# Patient Record
Sex: Female | Born: 1995 | Race: Black or African American | Hispanic: No | Marital: Single | Smoking: Current every day smoker
Health system: Southern US, Community
[De-identification: ages and names within clinical notes are randomized; demographics above are authoritative.]

## PROBLEM LIST (undated history)

## (undated) ENCOUNTER — Emergency Department (HOSPITAL_COMMUNITY): Admission: EM | Payer: No Typology Code available for payment source | Source: Home / Self Care

## (undated) DIAGNOSIS — J302 Other seasonal allergic rhinitis: Secondary | ICD-10-CM

## (undated) DIAGNOSIS — Z8619 Personal history of other infectious and parasitic diseases: Secondary | ICD-10-CM

---
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
## 2016-05-18 ENCOUNTER — Ambulatory Visit (HOSPITAL_COMMUNITY)
Admission: EM | Admit: 2016-05-18 | Discharge: 2016-05-18 | Disposition: A | Discharge status: Home / Self Care | Payer: No Typology Code available for payment source | Attending: Emergency Medicine | Admitting: Emergency Medicine

## 2016-05-18 ENCOUNTER — Encounter (HOSPITAL_COMMUNITY): Payer: Self-pay | Admitting: Emergency Medicine

## 2016-05-18 DIAGNOSIS — N760 Acute vaginitis: Secondary | ICD-10-CM | POA: Insufficient documentation

## 2016-05-18 DIAGNOSIS — N3001 Acute cystitis with hematuria: Secondary | ICD-10-CM | POA: Insufficient documentation

## 2016-05-18 DIAGNOSIS — F17210 Nicotine dependence, cigarettes, uncomplicated: Secondary | ICD-10-CM | POA: Insufficient documentation

## 2016-05-18 DIAGNOSIS — B009 Herpesviral infection, unspecified: Secondary | ICD-10-CM | POA: Insufficient documentation

## 2016-05-18 LAB — POCT URINALYSIS DIP (DEVICE)
Bilirubin Urine: NEGATIVE
GLUCOSE, UA: NEGATIVE mg/dL
Ketones, ur: NEGATIVE mg/dL
Nitrite: NEGATIVE
Protein, ur: NEGATIVE mg/dL
Specific Gravity, Urine: 1.025 (ref 1.005–1.030)
Urobilinogen, UA: 0.2 mg/dL (ref 0.0–1.0)
pH: 6.5 (ref 5.0–8.0)

## 2016-05-18 LAB — POCT PREGNANCY, URINE: Preg Test, Ur: NEGATIVE

## 2016-05-18 MED ORDER — VALACYCLOVIR HCL 1 G PO TABS: 1000.0000 mg | ORAL_TABLET | Freq: Two times a day (BID) | ORAL | 0 refills | Status: AC

## 2016-05-18 MED ORDER — FLUCONAZOLE 150 MG PO TABS: ORAL_TABLET | ORAL | 0 refills | Status: DC

## 2016-05-18 MED ORDER — NITROFURANTOIN MONOHYD MACRO 100 MG PO CAPS: 100.0000 mg | ORAL_CAPSULE | Freq: Two times a day (BID) | ORAL | 0 refills | Status: DC

## 2016-05-18 NOTE — ED Notes (Signed)
Call back number verified and updated in EPIC... Adv pt to not have SI until lab results comeback neg.... Also adv pt lab results will be on MyChart; instructions given .... Pt verb understanding.   

## 2016-05-18 NOTE — ED Triage Notes (Signed)
Patient has multiple complaints.  Reports burning with urination.  Patient reports vaginal discharge and has noticed a bump.  All symptoms have developed over the past week

## 2016-05-18 NOTE — Discharge Instructions (Signed)
Educated on Safe sexual practices.

## 2016-05-18 NOTE — ED Provider Notes (Signed)
CSN: [id number]     Arrival date & time 05/18/16  1041 History   None    Chief Complaint  Patient presents with  . SEXUALLY TRANSMITTED DISEASE  . Recurrent UTI   (Consider location/radiation/quality/duration/timing/severity/associated sxs/prior Treatment) 20 yo female presented with CC of pain and burning with urination x 6 days. White/clear vaginal discharge x 3-4 days with lesions to vaginal area.Pt reports having multiple sexual partners.      Past Medical History:  Diagnosis Date  . Seasonal allergies    History reviewed. No pertinent surgical history. No family history on file. Social History  Substance Use Topics  . Smoking status: Current Every Day Smoker    Types: Cigarettes  . Smokeless tobacco: Not on file  . Alcohol use Yes   OB History    No data available     Review of Systems: Pain and burning with urination. White Vaginal discharge with burning. Itchy/painful bumps to vaginal area.  Allergies  Patient has no known allergies.  Home Medications   Prior to Admission medications   Medication Sig Start Date End Date Taking? Authorizing Provider  cetirizine (ZYRTEC) 10 MG tablet Take 10 mg by mouth daily as needed for allergies.    Historical Provider, MD  ciprofloxacin (CIPRO) 500 MG tablet Take 1 tablet (500 mg total) by mouth 2 (two) times daily. Patient not taking: Reported on 05/18/2016 06/23/14   Viviano SimasLauren Robinson, NP  ibuprofen (ADVIL,MOTRIN) 200 MG tablet Take 200 mg by mouth every 6 (six) hours as needed for mild pain.    Historical Provider, MD  ibuprofen (ADVIL,MOTRIN) 600 MG tablet Take 1 tablet (600 mg total) by mouth every 6 (six) hours as needed. 05/30/14   Jennifer Piepenbrink, PA-C  nitrofurantoin, macrocrystal-monohydrate, (MACROBID) 100 MG capsule Take 1 capsule (100 mg total) by mouth 2 (two) times daily. Patient not taking: Reported on 05/18/2016 05/11/15   Samantha Tripp [place], PA-C  olopatadine (PATANOL) 0.1 % ophthalmic solution Place 1  drop into both eyes 2 (two) times daily as needed for allergies.    Historical Provider, MD  ondansetron (ZOFRAN ODT) 4 MG disintegrating tablet 1 tab sl q6-8h prn n/v/abd cramping 03/23/14   Viviano SimasLauren Robinson, NP  ondansetron (ZOFRAN ODT) 4 MG disintegrating tablet Take 1 tablet (4 mg total) by mouth every 8 (eight) hours as needed for nausea or vomiting. 05/30/14   Francee PiccoloJennifer Piepenbrink, PA-C   Meds Ordered and Administered this Visit  Medications - No data to display  BP 118/71 (BP Location: Right Arm)   Pulse 78   Temp 98.3 F (36.8 C) (Oral)   Resp 18   LMP 05/08/2016   SpO2 98%  No data found.   Physical Exam: Adbomen soft , non tender. BS present all 4 quadrants. Whiote curdy vaginal discharge with multiple painful vesicular ledions to B/l external labial folds  Urgent Care Course   Clinical Course     Procedures (including critical care time)  Labs Review Labs Reviewed  POCT URINALYSIS DIP (DEVICE) - Abnormal; Notable for the following:       Result Value   Hgb urine dipstick TRACE (*)    Leukocytes, UA TRACE (*)    All other components within normal limits  HSV CULTURE AND TYPING  URINE CULTURE  POCT PREGNANCY, URINE  CERVICOVAGINAL ANCILLARY ONLY    Imaging Review No results found.   Visual Acuity Review  Right Eye Distance:   Left Eye Distance:   Bilateral Distance:    Right Eye Near:  Left Eye Near:    Bilateral Near:         MDM   1. Vaginitis and vulvovaginitis   2. Acute cystitis with hematuria   3. Herpes simplex    Pt treated for UTI/Vaginal Cabndidiasis and Herpes simples based on history, clinical presentation and physical exam. Pt agrees with plan of care.Urine Culture and Swab for Vaginitis sent out. Educated on safe sex practices. Physical Exam Chaperoned By Gabriel RainwaterKimberly RN    Brixon Zhen, NP 05/18/16 1224    Laquandra Carrillo, NP 05/18/16 1228

## 2016-05-19 LAB — URINE CULTURE: Special Requests: NORMAL

## 2016-05-19 LAB — CERVICOVAGINAL ANCILLARY ONLY
Chlamydia: POSITIVE — AB
Neisseria Gonorrhea: POSITIVE — AB
WET PREP (BD AFFIRM): POSITIVE — AB

## 2016-05-20 LAB — HSV CULTURE AND TYPING

## 2016-05-31 ENCOUNTER — Encounter (HOSPITAL_COMMUNITY): Payer: Self-pay | Admitting: Emergency Medicine

## 2016-05-31 ENCOUNTER — Ambulatory Visit (HOSPITAL_COMMUNITY)
Admission: EM | Admit: 2016-05-31 | Discharge: 2016-05-31 | Disposition: A | Discharge status: Home / Self Care | Payer: PPO | Attending: Emergency Medicine | Admitting: Emergency Medicine

## 2016-05-31 DIAGNOSIS — A749 Chlamydial infection, unspecified: Secondary | ICD-10-CM | POA: Diagnosis not present

## 2016-05-31 DIAGNOSIS — A5400 Gonococcal infection of lower genitourinary tract, unspecified: Secondary | ICD-10-CM

## 2016-05-31 MED ORDER — CEFTRIAXONE SODIUM 250 MG IJ SOLR
250.0000 mg | Freq: Once | INTRAMUSCULAR | Status: AC
  Administered 2016-05-31: 250 mg via INTRAMUSCULAR

## 2016-05-31 MED ORDER — CEFTRIAXONE SODIUM 250 MG IJ SOLR
INTRAMUSCULAR | Status: AC
  Filled 2016-05-31: qty 250

## 2016-05-31 MED ORDER — AZITHROMYCIN 250 MG PO TABS
1000.0000 mg | ORAL_TABLET | Freq: Once | ORAL | Status: AC
  Administered 2016-05-31: 1000 mg via ORAL

## 2016-05-31 MED ORDER — STERILE WATER FOR INJECTION IJ SOLN
INTRAMUSCULAR | Status: AC
Start: 2016-05-31 — End: 2016-05-31
  Filled 2016-05-31: qty 10

## 2016-05-31 MED ORDER — AZITHROMYCIN 250 MG PO TABS
ORAL_TABLET | ORAL | Status: AC
  Filled 2016-05-31: qty 4

## 2016-05-31 NOTE — Discharge Instructions (Signed)
Return to Urgent Care with any complication.

## 2016-05-31 NOTE — ED Triage Notes (Signed)
[facility]. Patient has already been evaluated by E. Piedad ClimesHonig , MD and patient presented for treatment only no Provider visit.

## 2017-06-07 ENCOUNTER — Other Ambulatory Visit: Payer: Self-pay

## 2017-06-07 ENCOUNTER — Encounter (HOSPITAL_COMMUNITY): Payer: Self-pay | Admitting: Emergency Medicine

## 2017-06-07 ENCOUNTER — Emergency Department (HOSPITAL_COMMUNITY): Payer: Self-pay

## 2017-06-07 ENCOUNTER — Emergency Department (HOSPITAL_COMMUNITY)
Admission: EM | Admit: 2017-06-07 | Discharge: 2017-06-07 | Disposition: A | Discharge status: Home / Self Care | Payer: Self-pay | Attending: Emergency Medicine | Admitting: Emergency Medicine

## 2017-06-07 DIAGNOSIS — F17210 Nicotine dependence, cigarettes, uncomplicated: Secondary | ICD-10-CM | POA: Insufficient documentation

## 2017-06-07 DIAGNOSIS — R072 Precordial pain: Secondary | ICD-10-CM | POA: Insufficient documentation

## 2017-06-07 DIAGNOSIS — Z79899 Other long term (current) drug therapy: Secondary | ICD-10-CM | POA: Insufficient documentation

## 2017-06-07 LAB — BASIC METABOLIC PANEL
Anion gap: 9 (ref 5–15)
BUN: 9 mg/dL (ref 6–20)
CALCIUM: 9.2 mg/dL (ref 8.9–10.3)
CO2: 22 mmol/L (ref 22–32)
CREATININE: 0.79 mg/dL (ref 0.44–1.00)
Chloride: 105 mmol/L (ref 101–111)
GFR calc Af Amer: >60 mL/min (ref >60)
Glucose, Bld: 103 mg/dL — ABNORMAL HIGH (ref 65–99)
Potassium: 4.2 mmol/L (ref 3.5–5.1)
SODIUM: 136 mmol/L (ref 135–145)

## 2017-06-07 LAB — I-STAT BETA HCG BLOOD, ED (MC, WL, AP ONLY)

## 2017-06-07 LAB — CBC
HCT: 40.2 % (ref 36.0–46.0)
Hemoglobin: 13.6 g/dL (ref 12.0–15.0)
MCH: 30.3 pg (ref 26.0–34.0)
MCHC: 33.8 g/dL (ref 30.0–36.0)
MCV: 89.5 fL (ref 78.0–100.0)
PLATELETS: 358 10*3/uL (ref 150–400)
RBC: 4.49 MIL/uL (ref 3.87–5.11)
RDW: 13.1 % (ref 11.5–15.5)
WBC: 5.7 10*3/uL (ref 4.0–10.5)

## 2017-06-07 LAB — I-STAT TROPONIN, ED
Troponin i, poc: 0 ng/mL (ref 0.00–0.08)
Troponin i, poc: 0 ng/mL (ref 0.00–0.08)

## 2017-06-07 LAB — D-DIMER, QUANTITATIVE: D-Dimer, Quant: <0.27 ug/mL-FEU (ref 0.00–0.50)

## 2017-06-07 MED ORDER — CYCLOBENZAPRINE HCL 10 MG PO TABS: 10.0000 mg | ORAL_TABLET | Freq: Two times a day (BID) | ORAL | 0 refills | Status: AC | PRN

## 2017-06-07 MED ORDER — CYCLOBENZAPRINE HCL 10 MG PO TABS
10.0000 mg | ORAL_TABLET | Freq: Once | ORAL | Status: AC
  Administered 2017-06-07: 10 mg via ORAL
  Filled 2017-06-07: qty 1

## 2017-06-07 NOTE — ED Triage Notes (Signed)
Pt presents with CP and sob related to taking deep breaths; pt denies anxiety, CAD, asthma; pt states began 2-3 days ago and put off being seen for the issue but symptoms never went away; pt tearful in triage

## 2017-06-07 NOTE — ED Provider Notes (Signed)
[facility] Provider Note   CSN: [id number] Arrival date & time: 06/07/17  0416     History   Chief Complaint Chief Complaint  Patient presents with  . Shortness of Breath  . Chest Pain    HPI Sheri Spencer is a 22 y.o. female.  The history is provided by the patient and medical records. No language interpreter was used.  Chest Pain   This is a new problem. The current episode started more than 2 days ago. The problem occurs constantly. The problem has not changed since onset.The pain is associated with breathing and coughing. The pain is present in the substernal region. The pain is at a severity of 6/10. The pain is moderate. The quality of the pain is described as sharp and pleuritic. The pain does not radiate. Duration of episode(s) is 3 days. The symptoms are aggravated by deep breathing and certain positions. Associated symptoms include cough (dry). Pertinent negatives include no abdominal pain, no back pain, no diaphoresis, no exertional chest pressure, no fever, no headaches, no hemoptysis, no leg pain, no lower extremity edema, no malaise/fatigue, no nausea, no numbness, no palpitations, no PND, no shortness of breath, no sputum production, no syncope, no vomiting and no weakness. She has tried nothing for the symptoms. The treatment provided no relief.  Pertinent negatives for past medical history include no CAD, no COPD, no CHF, no DVT and no seizures.  Pertinent negatives for family medical history include: no CAD, no heart disease, no hyperlipidemia, no early MI and no PE.    Past Medical History:  Diagnosis Date  . Seasonal allergies     There are no active problems to display for this patient.   History reviewed. No pertinent surgical history.  OB History    No data available       Home Medications    Prior to Admission medications   Medication Sig Start Date End Date Taking? Authorizing Provider  cetirizine  (ZYRTEC) 10 MG tablet Take 10 mg by mouth daily as needed for allergies.    [provider]  fluconazole (DIFLUCAN) 150 MG tablet Take 1 tab PO Q 72 hours 05/18/16   Multani, Bhupinder, NP  ibuprofen (ADVIL,MOTRIN) 200 MG tablet Take 200 mg by mouth every 6 (six) hours as needed for mild pain.    [provider]  ibuprofen (ADVIL,MOTRIN) 600 MG tablet Take 1 tablet (600 mg total) by mouth every 6 (six) hours as needed. 05/30/14   Piepenbrink, Victorino DikeJennifer, PA-C  nitrofurantoin, macrocrystal-monohydrate, (MACROBID) 100 MG capsule Take 1 capsule (100 mg total) by mouth 2 (two) times daily. 05/18/16   Multani, Bhupinder, NP  olopatadine (PATANOL) 0.1 % ophthalmic solution Place 1 drop into both eyes 2 (two) times daily as needed for allergies.    [provider]  ondansetron (ZOFRAN ODT) 4 MG disintegrating tablet 1 tab sl q6-8h prn n/v/abd cramping 03/23/14   Viviano Simasobinson, Lauren, NP  ondansetron (ZOFRAN ODT) 4 MG disintegrating tablet Take 1 tablet (4 mg total) by mouth every 8 (eight) hours as needed for nausea or vomiting. 05/30/14   Piepenbrink, Victorino DikeJennifer, PA-C    Family History History reviewed. No pertinent family history.  Social History Social History   Tobacco Use  . Smoking status: Current Every Day Smoker    Types: Cigarettes  . Smokeless tobacco: Never Used  Substance Use Topics  . Alcohol use: Yes    Comment: social  . Drug use: Yes    Types: Marijuana  Allergies   Patient has no known allergies.   Review of Systems Review of Systems  Constitutional: Negative for chills, diaphoresis, fatigue, fever and malaise/fatigue.  HENT: Negative for congestion.   Eyes: Negative for photophobia and visual disturbance.  Respiratory: Positive for cough (dry). Negative for hemoptysis, sputum production, choking, chest tightness, shortness of breath, wheezing and stridor.   Cardiovascular: Positive for chest pain. Negative for palpitations, syncope and PND.    Gastrointestinal: Negative for abdominal pain, nausea and vomiting.  Genitourinary: Negative for dysuria, enuresis, flank pain and frequency.  Musculoskeletal: Negative for back pain, neck pain and neck stiffness.  Skin: Negative for rash and wound.  Neurological: Negative for seizures, weakness, light-headedness, numbness and headaches.  Psychiatric/Behavioral: Negative for agitation and confusion.  All other systems reviewed and are negative.    Physical Exam Updated Vital Signs BP 122/76 (BP Location: Right Arm)   Pulse 70   Temp 97.7 F (36.5 C) (Oral)   Resp 12   Ht 5' (1.524 m)   Wt 70.3 kg (155 lb)   LMP 05/22/2017   SpO2 100%   BMI 30.27 kg/m   Physical Exam  Constitutional: She is oriented to person, place, and time. She appears well-developed and well-nourished.  Non-toxic appearance. She does not appear ill. No distress.  HENT:  Head: Normocephalic.  Nose: Nose normal.  Mouth/Throat: Oropharynx is clear and moist. No oropharyngeal exudate.  Eyes: Pupils are equal, round, and reactive to light.  Neck: Normal range of motion.  Cardiovascular: Normal rate and intact distal pulses. Exam reveals no S3 and no S4.  No murmur heard. Pulmonary/Chest: Effort normal and breath sounds normal. No respiratory distress. She has no wheezes. She has no rales. She exhibits tenderness.    Abdominal: Soft. She exhibits no distension. There is no tenderness.  Musculoskeletal: She exhibits tenderness. She exhibits no edema.  Neurological: She is alert and oriented to person, place, and time. No sensory deficit. She exhibits normal muscle tone.  Skin: Capillary refill takes less than 2 seconds. She is not diaphoretic. No erythema.  Psychiatric: She has a normal mood and affect.  Nursing note and vitals reviewed.    ED Treatments / Results  Labs (all labs ordered are listed, but only abnormal results are displayed) Labs Reviewed  BASIC METABOLIC PANEL - Abnormal; Notable for  the following components:      Result Value   Glucose, Bld 103 (*)    All other components within normal limits  CBC  D-DIMER, QUANTITATIVE (NOT AT [facility])  I-STAT TROPONIN, ED  I-STAT BETA HCG BLOOD, ED ([facility], [facility], [facility] ONLY)  I-STAT TROPONIN, ED    EKG  EKG Interpretation  Date/Time:  Thursday June 07 2017 04:24:54 EST Ventricular Rate:  90 PR Interval:    QRS Duration: 76 QT Interval:  336 QTC Calculation: 411 R Axis:   91 Text Interpretation:  Sinus rhythm Rightward axis Abnormal ECG When compared with ECG of 01/18/2014, No significant change was found Confirmed by Dione Booze ([id number]) on 06/07/2017 4:59:09 AM Also confirmed by Dione Booze ([id number]), editor Elita Quick [phone])  on 06/07/2017 6:51:41 AM       Radiology Dg Chest 2 View  Result Date: 06/07/2017 CLINICAL DATA:  22 year old female with chest pain. EXAM: CHEST  2 VIEW COMPARISON:  Chest radiograph dated 01/18/2014 FINDINGS: The heart size and mediastinal contours are within normal limits. Both lungs are clear. The visualized skeletal structures are unremarkable. IMPRESSION: No active cardiopulmonary disease. Electronically Signed   By:  Elgie Collard M.D.   On: 06/07/2017 05:32    Procedures Procedures (including critical care time)  Medications Ordered in ED Medications  cyclobenzaprine (FLEXERIL) tablet 10 mg (10 mg Oral Given 06/07/17 1610)     Initial Impression / Assessment and Plan / ED Course  I have reviewed the triage vital signs and the nursing notes.  Pertinent labs & imaging results that were available during my care of the patient were reviewed by me and considered in my medical decision making (see chart for details).     Sheri Spencer is a 22 y.o. female with no significant past medical history who presents with chest pain.  Patient reports that for the last 3 days, she has been having a very pleuritic and sharp chest pain whenever she takes a deep breath.  It is in her central and left  chest.  She denies any trauma.  She reports a dry cough but no fevers, chills, hemoptysis, or production in the cough.  She denies any nausea, vomiting, palpitations, diaphoresis, and no family history of thromboembolism disease or heart disease.  She denies taking any medications including no birth control medications.  She describes the pain as a 6 out of 10 on arrival but was a 10 out of 10 earlier today.  It started suddenly and has been fluctuating but constant.  On exam, patient has tenderness in her central left chest.  Patient had no focal neurologic deficits on my exam.  Pulses are symmetric in upper and lower 70s.  Patient's lungs were clear with no crackles or wheezes.  Abdomen was nontender.  EKG showed no STEMI or other significant abnormality.  Chest x-ray was unremarkable.  Due to the extreme pleurisy of the symptoms, a d-dimer will be added with a delta troponin.  Patient is likely having muscular skeletal pain thus, will be given a muscle relaxant during initial workup.  If symptoms are improving and workup is reassuring, anticipate patient will be safe for discharge home.  D-dimer negative and delta troponin negative.  Patient reports her pain had not worsened.  Patient had a heart score of 1.  Given reassuring heart score negative troponin, patient was felt stable for discharge home.  Patient was given information for PCP follow-up as well as return precautions for new or worsened symptoms.  Patient given prescription for Flexeril as this is likely musculoskeletal in nature.  Patient and family had no other questions or concerns and patient was discharged in good condition after reassuring workup.   Final Clinical Impressions(s) / ED Diagnoses   Final diagnoses:  Precordial pain    ED Discharge Orders        Ordered    cyclobenzaprine (FLEXERIL) 10 MG tablet  2 times daily PRN     06/07/17 0929     \ Clinical Impression: 1. Precordial pain     Disposition:  Discharge  Condition: Good  I have discussed the results, Dx and Tx plan with the pt(& family if present). He/she/they expressed understanding and agree(s) with the plan. Discharge instructions discussed at great length. Strict return precautions discussed and pt &/or family have verbalized understanding of the instructions. No further questions at time of discharge.    Discharge Medication List as of 06/07/2017  9:31 AM    START taking these medications   Details  cyclobenzaprine (FLEXERIL) 10 MG tablet Take 1 tablet (10 mg total) by mouth 2 (two) times daily as needed for muscle spasms., Starting Thu 06/07/2017, Print  Follow Up: [facility] [address] [place] [address] [phone] Schedule an appointment as soon as possible for a visit    [facility] EMERGENCY DEPARTMENT [address] [id number] mc [place] [address] [phone]  If symptoms worsen     Tegeler, Canary Brim, MD 06/07/17 1143

## 2017-06-07 NOTE — Discharge Instructions (Signed)
After your workup, we suspect the cause of your pain is musculoskeletal.  Your tests were all reassuring.  Please use the muscle relaxant to help with the discomfort.  Please follow-up with a primary care physician in several days for reassessment.  If any symptoms change or worsen, please return to the nearest emergency department.

## 2017-10-30 ENCOUNTER — Emergency Department (HOSPITAL_COMMUNITY)
Admission: EM | Admit: 2017-10-30 | Discharge: 2017-10-30 | Disposition: A | Discharge status: Home / Self Care | Payer: Self-pay | Attending: Emergency Medicine | Admitting: Emergency Medicine

## 2017-10-30 ENCOUNTER — Emergency Department (HOSPITAL_COMMUNITY): Payer: Self-pay

## 2017-10-30 ENCOUNTER — Other Ambulatory Visit: Payer: Self-pay

## 2017-10-30 DIAGNOSIS — Y999 Unspecified external cause status: Secondary | ICD-10-CM | POA: Insufficient documentation

## 2017-10-30 DIAGNOSIS — Y939 Activity, unspecified: Secondary | ICD-10-CM | POA: Insufficient documentation

## 2017-10-30 DIAGNOSIS — Y929 Unspecified place or not applicable: Secondary | ICD-10-CM | POA: Insufficient documentation

## 2017-10-30 DIAGNOSIS — J302 Other seasonal allergic rhinitis: Secondary | ICD-10-CM | POA: Insufficient documentation

## 2017-10-30 DIAGNOSIS — M79602 Pain in left arm: Secondary | ICD-10-CM

## 2017-10-30 DIAGNOSIS — F17210 Nicotine dependence, cigarettes, uncomplicated: Secondary | ICD-10-CM | POA: Insufficient documentation

## 2017-10-30 DIAGNOSIS — X501XXA Overexertion from prolonged static or awkward postures, initial encounter: Secondary | ICD-10-CM | POA: Insufficient documentation

## 2017-10-30 DIAGNOSIS — Z79899 Other long term (current) drug therapy: Secondary | ICD-10-CM | POA: Insufficient documentation

## 2017-10-30 DIAGNOSIS — S63502A Unspecified sprain of left wrist, initial encounter: Secondary | ICD-10-CM | POA: Insufficient documentation

## 2017-10-30 MED ORDER — IBUPROFEN 600 MG PO TABS: 600.0000 mg | ORAL_TABLET | Freq: Four times a day (QID) | ORAL | 0 refills | Status: AC | PRN

## 2017-10-30 MED ORDER — IBUPROFEN 100 MG/5ML PO SUSP
10.0000 mg/kg | Freq: Once | ORAL | Status: AC
Start: 2017-10-30 — End: 2017-10-30
  Administered 2017-10-30: 718 mg via ORAL
  Filled 2017-10-30 (×2): qty 40

## 2017-10-30 NOTE — Discharge Instructions (Addendum)
1. Medications: alternate ibuprofen and tylenol for pain control, usual home medications 2. Treatment: rest, ice, elevate and use brace, drink plenty of fluids, gentle stretching 3. Follow Up: Please followup with orthopedics as directed or your PCP in 1 week if no improvement for discussion of your diagnoses and further evaluation after today's visit; if you do not have a primary care doctor use the resource guide provided to find one; Please return to the ER for worsening symptoms or other concerns  
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
## 2018-03-12 ENCOUNTER — Encounter (HOSPITAL_COMMUNITY): Payer: Self-pay | Admitting: *Deleted

## 2018-03-12 ENCOUNTER — Inpatient Hospital Stay (HOSPITAL_COMMUNITY)
Admission: AD | Admit: 2018-03-12 | Discharge: 2018-03-13 | Disposition: A | Discharge status: Home / Self Care | Payer: PPO | Source: Ambulatory Visit | Attending: Obstetrics & Gynecology | Admitting: Obstetrics & Gynecology

## 2018-03-12 DIAGNOSIS — F17210 Nicotine dependence, cigarettes, uncomplicated: Secondary | ICD-10-CM | POA: Insufficient documentation

## 2018-03-12 DIAGNOSIS — N730 Acute parametritis and pelvic cellulitis: Secondary | ICD-10-CM | POA: Insufficient documentation

## 2018-03-12 DIAGNOSIS — N760 Acute vaginitis: Secondary | ICD-10-CM | POA: Insufficient documentation

## 2018-03-12 DIAGNOSIS — B9689 Other specified bacterial agents as the cause of diseases classified elsewhere: Secondary | ICD-10-CM

## 2018-03-12 HISTORY — DX: Personal history of other infectious and parasitic diseases: Z86.19

## 2018-03-12 LAB — POCT PREGNANCY, URINE: Preg Test, Ur: NEGATIVE

## 2018-03-12 NOTE — MAU Note (Signed)
PT SAYS  LMP WAS  LAST Tuesday  THROUGH Friday.   THEN  STARTED BLEEDING AGAIN Sunday.  USUALLY CYCLES ARE REG.   NO BIRTH CONTROL.  LAST SEX-  1 WEEK AGO.  NO HPT.      HURTS LOWER ABD - STARTED  Sunday-    TYLENOL - 2 TABS-    NO RELIEF

## 2018-03-13 ENCOUNTER — Encounter (HOSPITAL_COMMUNITY): Payer: Self-pay | Admitting: Student

## 2018-03-13 DIAGNOSIS — N730 Acute parametritis and pelvic cellulitis: Secondary | ICD-10-CM

## 2018-03-13 LAB — URINALYSIS, ROUTINE W REFLEX MICROSCOPIC
Bacteria, UA: NONE SEEN
Bilirubin Urine: NEGATIVE
Glucose, UA: NEGATIVE mg/dL
KETONES UR: NEGATIVE mg/dL
Nitrite: NEGATIVE
PROTEIN: NEGATIVE mg/dL
Specific Gravity, Urine: 1.023 (ref 1.005–1.030)
pH: 6 (ref 5.0–8.0)

## 2018-03-13 LAB — GC/CHLAMYDIA PROBE AMP (~~LOC~~) NOT AT ARMC
Chlamydia: NEGATIVE
Neisseria Gonorrhea: POSITIVE — AB

## 2018-03-13 LAB — WET PREP, GENITAL
Sperm: NONE SEEN
TRICH WET PREP: NONE SEEN
Yeast Wet Prep HPF POC: NONE SEEN

## 2018-03-13 MED ORDER — CEFTRIAXONE SODIUM 250 MG IJ SOLR
250.0000 mg | Freq: Once | INTRAMUSCULAR | Status: AC
  Administered 2018-03-13: 250 mg via INTRAMUSCULAR
  Filled 2018-03-13: qty 250

## 2018-03-13 MED ORDER — AZITHROMYCIN 250 MG PO TABS
1000.0000 mg | ORAL_TABLET | Freq: Once | ORAL | Status: AC
  Administered 2018-03-13: 1000 mg via ORAL
  Filled 2018-03-13: qty 4

## 2018-03-13 MED ORDER — METRONIDAZOLE 500 MG PO TABS: 500.0000 mg | ORAL_TABLET | Freq: Two times a day (BID) | ORAL | 0 refills | Status: AC

## 2018-03-13 MED ORDER — KETOROLAC TROMETHAMINE 60 MG/2ML IM SOLN
60.0000 mg | Freq: Once | INTRAMUSCULAR | Status: AC
  Administered 2018-03-13: 60 mg via INTRAMUSCULAR
  Filled 2018-03-13: qty 2

## 2018-03-13 MED ORDER — DOXYCYCLINE HYCLATE 100 MG PO CAPS: 100.0000 mg | ORAL_CAPSULE | Freq: Two times a day (BID) | ORAL | 0 refills | Status: AC

## 2018-03-13 NOTE — Discharge Instructions (Signed)
Pelvic Inflammatory Disease °Pelvic inflammatory disease (PID) refers to an infection in some or all of the female organs. The infection can be in the uterus, ovaries, fallopian tubes, or the surrounding tissues in the pelvis. PID can cause abdominal or pelvic pain that comes on suddenly (acute pelvic pain). PID is a serious infection because it can lead to lasting (chronic) pelvic pain or the inability to have children (infertility). °What are the causes? °This condition is most often caused by an infection that is spread during sexual contact. However, the infection can also be caused by the normal bacteria that are found in the vaginal tissues if these bacteria travel upward into the reproductive organs. PID can also occur following: °· The birth of a baby. °· A miscarriage. °· An abortion. °· Major pelvic surgery. °· The use of an intrauterine device (IUD). °· A sexual assault. ° °What increases the risk? °This condition is more likely to develop in women who: °· Are younger than 21 years of age. °· Are sexually active at a young age. °· Use nonbarrier contraception. °· Have multiple sexual partners. °· Have sex with someone who has symptoms of an STD (sexually transmitted disease). °· Use oral contraception. ° °At times, certain behaviors can also increase the possibility of getting PID, such as: °· Using a vaginal douche. °· Having an IUD in place. ° °What are the signs or symptoms? °Symptoms of this condition include: °· Abdominal or pelvic pain. °· Fever. °· Chills. °· Abnormal vaginal discharge. °· Abnormal uterine bleeding. °· Unusual pain shortly after the end of a menstrual period. °· Painful urination. °· Pain with sexual intercourse. °· Nausea and vomiting. ° °How is this diagnosed? °To diagnose this condition, your health care provider will do a physical exam and take your medical history. A pelvic exam typically reveals great tenderness in the uterus and the surrounding pelvic tissues. You may also  have tests, such as: °· Lab tests, including a pregnancy test, blood tests, and urine test. °· Culture tests of the vagina and cervix to check for an STD. °· Ultrasound. °· A laparoscopic procedure to look inside the pelvis. °· Examining vaginal secretions under a microscope. ° °How is this treated? °Treatment for this condition may involve one or more approaches. °· Antibiotic medicines may be prescribed to be taken by mouth. °· Sexual partners may need to be treated if the infection is caused by an STD. °· For more severe cases, hospitalization may be needed to give antibiotics directly into a vein through an IV tube. °· Surgery may be needed if other treatments do not help, but this is rare. ° °It may take weeks until you are completely well. If you are diagnosed with PID, you should also be checked for human immunodeficiency virus (HIV). Your health care provider may test you for infection again 3 months after treatment. You should not have unprotected sex. °Follow these instructions at home: °· Take over-the-counter and prescription medicines only as told by your health care provider. °· If you were prescribed an antibiotic medicine, take it as told by your health care provider. Do not stop taking the antibiotic even if you start to feel better. °· Do not have sexual intercourse until treatment is completed or as told by your health care provider. If PID is confirmed, your recent sexual partners will need treatment, especially if you had unprotected sex. °· Keep all follow-up visits as told by your health care provider. This is important. °Contact a health care   provider if: °· You have increased or abnormal vaginal discharge. °· Your pain does not improve. °· You vomit. °· You have a fever. °· You cannot tolerate your medicines. °· Your partner has an STD. °· You have pain when you urinate. °Get help right away if: °· You have increased abdominal or pelvic pain. °· You have chills. °· Your symptoms are not  better in 72 hours even with treatment. °This information is not intended to replace advice given to you by your health care provider. Make sure you discuss any questions you have with your health care provider. °Document Released: 05/15/2005 Document Revised: 10/21/2015 Document Reviewed: 06/22/2014 °[employer] Interactive Patient Education © 2018 [employer] ° °

## 2018-03-13 NOTE — MAU Provider Note (Signed)
Chief Complaint: Abdominal Pain   First Provider Initiated Contact with Patient 03/13/18 0035     SUBJECTIVE HPI: Sheri Spencer is a 22 y.o. non pregnant patient who presents to [facility] Admissions reporting abdominal pain. Pain started 2 days ago but worsened tonight. LMP was last week but started spotting again today. Is sexually active with 2 partners & does not use condoms. Denies fever/chills, n/v/d, dysuria, or vaginal discharge. Has not treated pain.   Location: suprapubic Quality: cramping Severity: 8/10 on pain scale Duration: 2 days Timing: intermittent Modifying factors: changing positions makes pain worse. Nothing makes better. Hasn't tx symptoms Associated signs and symptoms: none  Past Medical History:  Diagnosis Date  . Hx of chlamydia infection 2016  . Seasonal allergies    OB History  Gravida Para Term Preterm AB Living  0 0 0 0 0 0  SAB TAB Ectopic Multiple Live Births  0 0 0 0 0   History reviewed. No pertinent surgical history. Social History   Socioeconomic History  . Marital status: Single    Spouse name: Not on file  . Number of children: Not on file  . Years of education: Not on file  . Highest education level: Not on file  Occupational History  . Not on file  Social Needs  . Financial resource strain: Not on file  . Food insecurity:    Worry: Not on file    Inability: Not on file  . Transportation needs:    Medical: Not on file    Non-medical: Not on file  Tobacco Use  . Smoking status: Current Every Day Smoker    Types: Cigarettes  . Smokeless tobacco: Never Used  Substance and Sexual Activity  . Alcohol use: Yes    Comment: social  . Drug use: Yes    Types: Marijuana  . Sexual activity: Yes    Birth control/protection: None  Lifestyle  . Physical activity:    Days per week: Not on file    Minutes per session: Not on file  . Stress: Not on file  Relationships  . Social connections:    Talks on phone: Not on file    Gets  together: Not on file    Attends religious service: Not on file    Active member of club or organization: Not on file    Attends meetings of clubs or organizations: Not on file    Relationship status: Not on file  . Intimate partner violence:    Fear of current or ex partner: Not on file    Emotionally abused: Not on file    Physically abused: Not on file    Forced sexual activity: Not on file  Other Topics Concern  . Not on file  Social History Narrative  . Not on file   History reviewed. No pertinent family history. No current facility-administered medications on file prior to encounter.    Current Outpatient Medications on File Prior to Encounter  Medication Sig Dispense Refill  . acetaminophen (TYLENOL) 325 MG tablet Take 325 mg by mouth every 6 (six) hours as needed for mild pain or headache.    . cyclobenzaprine (FLEXERIL) 10 MG tablet Take 1 tablet (10 mg total) by mouth 2 (two) times daily as needed for muscle spasms. 20 tablet 0  . ibuprofen (ADVIL,MOTRIN) 600 MG tablet Take 1 tablet (600 mg total) by mouth every 6 (six) hours as needed for mild pain. 30 tablet 0   No Known Allergies  I have reviewed  patient's Past Medical Hx, Surgical Hx, Family Hx, Social Hx, medications and allergies.   Review of Systems  Constitutional: Negative.   Gastrointestinal: Positive for abdominal pain. Negative for diarrhea, nausea and vomiting.  Genitourinary: Positive for vaginal bleeding. Negative for dyspareunia, dysuria and vaginal discharge.    OBJECTIVE Patient Vitals for the past 24 hrs:  BP Temp Temp src Pulse Resp Height  03/13/18 0310 (!) 120/58 - - 75 - -  03/13/18 0140 - 99.5 F (37.5 C) Oral - - -  03/12/18 2340 139/74 99.8 F (37.7 C) Oral 99 20 5' (1.524 m)   Constitutional: Well-developed, well-nourished female in no acute distress.  Cardiovascular: normal rate & rhythm, no murmur Respiratory: normal rate and effort. Lung sounds clear throughout GI: Abd soft,  non-tender, Pos BS x 4. No guarding or rebound tenderness MS: Extremities nontender, no edema, normal ROM Neurologic: Alert and oriented x 4.  GU:     SPECULUM EXAM: NEFG, pt did not tolerate speculum exam  BIMANUAL: +CMT.  uterus normal size, no adnexal tenderness or masses.    LAB RESULTS Results for orders placed or performed during the hospital encounter of 03/12/18 (from the past 24 hour(s))  Urinalysis, Routine w reflex microscopic     Status: Abnormal   Collection Time: 03/12/18 11:53 PM  Result Value Ref Range   Color, Urine YELLOW YELLOW   APPearance HAZY (A) CLEAR   Specific Gravity, Urine 1.023 1.005 - 1.030   pH 6.0 5.0 - 8.0   Glucose, UA NEGATIVE NEGATIVE mg/dL   Hgb urine dipstick SMALL (A) NEGATIVE   Bilirubin Urine NEGATIVE NEGATIVE   Ketones, ur NEGATIVE NEGATIVE mg/dL   Protein, ur NEGATIVE NEGATIVE mg/dL   Nitrite NEGATIVE NEGATIVE   Leukocytes, UA TRACE (A) NEGATIVE   RBC / HPF 6-10 0 - 5 RBC/hpf   WBC, UA 6-10 0 - 5 WBC/hpf   Bacteria, UA NONE SEEN NONE SEEN   Squamous Epithelial / LPF 6-10 0 - 5   Mucus PRESENT   Pregnancy, urine POC     Status: None   Collection Time: 03/12/18 11:54 PM  Result Value Ref Range   Preg Test, Ur NEGATIVE NEGATIVE  Wet prep, genital     Status: Abnormal   Collection Time: 03/13/18 12:58 AM  Result Value Ref Range   Yeast Wet Prep HPF POC NONE SEEN NONE SEEN   Trich, Wet Prep NONE SEEN NONE SEEN   Clue Cells Wet Prep HPF POC PRESENT (A) NONE SEEN   WBC, Wet Prep HPF POC MANY (A) NONE SEEN   Sperm NONE SEEN     IMAGING No results found.  MAU COURSE Orders Placed This Encounter  Procedures  . Wet prep, genital  . Urinalysis, Routine w reflex microscopic  . Pregnancy, urine POC  . Discharge patient   Meds ordered this encounter  Medications  . cefTRIAXone (ROCEPHIN) injection 250 mg    Order Specific Question:   Antibiotic Indication:    Answer:   STD  . ketorolac (TORADOL) injection 60 mg  . azithromycin  (ZITHROMAX) tablet 1,000 mg  . doxycycline (VIBRAMYCIN) 100 MG capsule    Sig: Take 1 capsule (100 mg total) by mouth 2 (two) times daily.    Dispense:  28 capsule    Refill:  0    Order Specific Question:   Supervising Provider    Answer:   Jaynie Collins A [[id number]]  . metroNIDAZOLE (FLAGYL) 500 MG tablet    Sig: Take 1 tablet (  500 mg total) by mouth 2 (two) times daily.    Dispense:  14 tablet    Refill:  0    Order Specific Question:   Supervising Provider    Answer:   Jaynie Collins A [[id number]]    MDM UPT negative VSS IM toradol for pain Pt didn't tolerate exam. + CMT. Many bacteria on wet prep. Will tx for PID Rocephin & azithromycin given in MAU. Discussed good rx app with patient & will rx doxy & flagyl x 2 weeks. No intercourse x 2 weeks.   ASSESSMENT 1. PID (acute pelvic inflammatory disease)   2. BV (bacterial vaginosis)     PLAN Discharge home in stable condition. Discussed reasons to return to MAU including worsening symptoms & fever/chills GC/CT pending  Allergies as of 03/13/2018   No Known Allergies     Medication List    TAKE these medications   acetaminophen 325 MG tablet Commonly known as:  TYLENOL Take 325 mg by mouth every 6 (six) hours as needed for mild pain or headache.   cyclobenzaprine 10 MG tablet Commonly known as:  FLEXERIL Take 1 tablet (10 mg total) by mouth 2 (two) times daily as needed for muscle spasms.   doxycycline 100 MG capsule Commonly known as:  VIBRAMYCIN Take 1 capsule (100 mg total) by mouth 2 (two) times daily.   ibuprofen 600 MG tablet Commonly known as:  ADVIL,MOTRIN Take 1 tablet (600 mg total) by mouth every 6 (six) hours as needed for mild pain.   metroNIDAZOLE 500 MG tablet Commonly known as:  FLAGYL Take 1 tablet (500 mg total) by mouth 2 (two) times daily.        Judeth Horn, NP 03/13/2018  3:58 AM

## 2018-03-14 ENCOUNTER — Telehealth (HOSPITAL_COMMUNITY): Payer: Self-pay

## 2018-08-17 ENCOUNTER — Other Ambulatory Visit: Payer: Self-pay

## 2018-08-17 ENCOUNTER — Emergency Department (HOSPITAL_COMMUNITY)
Admission: EM | Admit: 2018-08-17 | Discharge: 2018-08-17 | Disposition: A | Discharge status: Home / Self Care | Payer: Self-pay | Attending: Emergency Medicine | Admitting: Emergency Medicine

## 2018-08-17 ENCOUNTER — Emergency Department (HOSPITAL_COMMUNITY): Payer: Self-pay

## 2018-08-17 ENCOUNTER — Encounter (HOSPITAL_COMMUNITY): Payer: Self-pay | Admitting: Emergency Medicine

## 2018-08-17 DIAGNOSIS — Y939 Activity, unspecified: Secondary | ICD-10-CM | POA: Insufficient documentation

## 2018-08-17 DIAGNOSIS — W25XXXA Contact with sharp glass, initial encounter: Secondary | ICD-10-CM | POA: Insufficient documentation

## 2018-08-17 DIAGNOSIS — Y929 Unspecified place or not applicable: Secondary | ICD-10-CM | POA: Insufficient documentation

## 2018-08-17 DIAGNOSIS — Z23 Encounter for immunization: Secondary | ICD-10-CM | POA: Insufficient documentation

## 2018-08-17 DIAGNOSIS — Y999 Unspecified external cause status: Secondary | ICD-10-CM | POA: Insufficient documentation

## 2018-08-17 DIAGNOSIS — S41111A Laceration without foreign body of right upper arm, initial encounter: Secondary | ICD-10-CM | POA: Insufficient documentation

## 2018-08-17 DIAGNOSIS — F17210 Nicotine dependence, cigarettes, uncomplicated: Secondary | ICD-10-CM | POA: Insufficient documentation

## 2018-08-17 MED ORDER — LORAZEPAM 1 MG PO TABS
2.0000 mg | ORAL_TABLET | Freq: Once | ORAL | Status: AC
  Administered 2018-08-17: 2 mg via ORAL
  Filled 2018-08-17: qty 2

## 2018-08-17 MED ORDER — TETANUS-DIPHTH-ACELL PERTUSSIS 5-2.5-18.5 LF-MCG/0.5 IM SUSP
0.5000 mL | Freq: Once | INTRAMUSCULAR | Status: AC
  Administered 2018-08-17: 0.5 mL via INTRAMUSCULAR
  Filled 2018-08-17: qty 0.5

## 2018-08-17 MED ORDER — HYDROCODONE-ACETAMINOPHEN 5-325 MG PO TABS: 1.0000 | ORAL_TABLET | Freq: Four times a day (QID) | ORAL | 0 refills | Status: AC | PRN

## 2018-08-17 MED ORDER — LIDOCAINE HCL (PF) 1 % IJ SOLN
30.0000 mL | Freq: Once | INTRAMUSCULAR | Status: AC
  Administered 2018-08-17: 30 mL via INTRADERMAL
  Filled 2018-08-17: qty 30

## 2018-08-17 NOTE — ED Notes (Signed)
ED Provider at bedside. 

## 2018-08-17 NOTE — Progress Notes (Signed)
Orthopedic Tech Progress Note Patient Details:  Sheri Spencer 1995/10/05 [id number]  Ortho Devices Type of Ortho Device: Short arm splint Ortho Device/Splint Interventions: Application, Ordered   Post Interventions Patient Tolerated: Well Instructions Provided: Care of device, Adjustment of device   Norva Karvonen T 08/17/2018, 5:22 AM

## 2018-08-17 NOTE — ED Provider Notes (Signed)
[facility] EMERGENCY DEPARTMENT Provider Note   CSN: [id number] Arrival date & time: 08/17/18  2563    History   Chief Complaint Chief Complaint  Patient presents with  . Laceration    HPI Sheri Spencer is a 23 y.o. female.      Laceration  Location:  Shoulder/arm Shoulder/arm laceration location:  R forearm Length:  Triangular wound with 3cm wounds, two other wounds each 3 cm Depth:  Through underlying tissue Quality: avulsion and straight   Bleeding: controlled   Time since incident:  1 hour Laceration mechanism:  Broken glass Pain details:    Quality:  Sharp   Severity:  Mild   Timing:  Constant   Progression:  Unchanged Foreign body present:  No foreign bodies Relieved by:  Nothing Worsened by:  Nothing Ineffective treatments:  None tried Tetanus status:  Unknown   Past Medical History:  Diagnosis Date  . Hx of chlamydia infection 2016  . Seasonal allergies     There are no active problems to display for this patient.   History reviewed. No pertinent surgical history.   OB History    Gravida  0   Para  0   Term  0   Preterm  0   AB  0   Living  0     SAB  0   TAB  0   Ectopic  0   Multiple  0   Live Births  0            Home Medications    Prior to Admission medications   Medication Sig Start Date End Date Taking? Authorizing Provider  acetaminophen (TYLENOL) 325 MG tablet Take 325 mg by mouth every 6 (six) hours as needed for mild pain or headache.    [provider]  cyclobenzaprine (FLEXERIL) 10 MG tablet Take 1 tablet (10 mg total) by mouth 2 (two) times daily as needed for muscle spasms. 06/07/17   Tegeler, Canary Brim, MD  doxycycline (VIBRAMYCIN) 100 MG capsule Take 1 capsule (100 mg total) by mouth 2 (two) times daily. 03/13/18   Judeth Horn, NP  HYDROcodone-acetaminophen (NORCO/VICODIN) 5-325 MG tablet Take 1-2 tablets by mouth every 6 (six) hours as needed for severe pain. 08/17/18    Adithya Difrancesco, Barbara Cower, MD  ibuprofen (ADVIL,MOTRIN) 600 MG tablet Take 1 tablet (600 mg total) by mouth every 6 (six) hours as needed for mild pain. 10/30/17   Muthersbaugh, Dahlia Client, PA-C  metroNIDAZOLE (FLAGYL) 500 MG tablet Take 1 tablet (500 mg total) by mouth 2 (two) times daily. 03/13/18   Judeth Horn, NP    Family History History reviewed. No pertinent family history.  Social History Social History   Tobacco Use  . Smoking status: Current Every Day Smoker    Types: Cigarettes  . Smokeless tobacco: Never Used  Substance Use Topics  . Alcohol use: Yes    Comment: social  . Drug use: Yes    Types: Marijuana     Allergies   Patient has no known allergies.   Review of Systems Review of Systems  All other systems reviewed and are negative.    Physical Exam Updated Vital Signs BP 117/77   Pulse 84   Temp 98.8 F (37.1 C) (Oral)   Resp 20   Ht 5' (1.524 m)   Wt 72.6 kg   SpO2 97%   BMI 31.25 kg/m   Physical Exam Vitals signs and nursing note reviewed.  Constitutional:  Appearance: She is well-developed.  HENT:     Head: Normocephalic and atraumatic.     Nose: Nose normal. No congestion or rhinorrhea.     Mouth/Throat:     Mouth: Mucous membranes are moist.  Eyes:     Extraocular Movements: Extraocular movements intact.     Conjunctiva/sclera: Conjunctivae normal.  Neck:     Musculoskeletal: Normal range of motion.  Cardiovascular:     Rate and Rhythm: Normal rate and regular rhythm.  Pulmonary:     Effort: No respiratory distress.     Breath sounds: No stridor.  Abdominal:     General: Abdomen is flat. There is no distension.  Skin:    General: Skin is warm and dry.     Comments: triangular wound with 3cm wounds, two other wounds each 3 cm  Neurological:     General: No focal deficit present.     Mental Status: She is alert.     Cranial Nerves: No cranial nerve deficit.     Sensory: No sensory deficit.      ED Treatments / Results  Labs (all  labs ordered are listed, but only abnormal results are displayed) Labs Reviewed - No data to display  EKG None  Radiology Dg Forearm Right  Result Date: 08/17/2018 CLINICAL DATA:  Initial evaluation for acute trauma, laceration. EXAM: RIGHT FOREARM - 2 VIEW COMPARISON:  None. FINDINGS: Large soft tissue laceration at the ulnar aspect of the proximal right forearm. No radiopaque foreign body. No acute fracture or dislocation. Limited views of the wrist and elbow unremarkable. IMPRESSION: 1. Soft tissue laceration at the proximal right for period no radiopaque foreign body. 2. No acute osseous abnormality. Electronically Signed   By: Rise MuBenjamin  McClintock M.D.   On: 08/17/2018 04:40    Procedures .Marland Kitchen.Laceration Repair Date/Time: 08/17/2018 7:41 AM Performed by: Marily MemosMesner, Maudine Kluesner, MD Authorized by: Marily MemosMesner, Nataleigh Griffin, MD   Consent:    Consent obtained:  Verbal   Consent given by:  Patient   Risks discussed:  Infection, need for additional repair, nerve damage, poor wound healing, poor cosmetic result, pain, retained foreign body, tendon damage and vascular damage   Alternatives discussed:  No treatment, delayed treatment and observation Anesthesia (see MAR for exact dosages):    Anesthesia method:  Local infiltration   Local anesthetic:  Lidocaine 1% w/o epi Laceration details:    Location:  Shoulder/arm   Shoulder/arm location:  R lower arm   Length (cm):  3   Depth (mm):  4 Repair type:    Repair type:  Simple Pre-procedure details:    Preparation:  Patient was prepped and draped in usual sterile fashion and imaging obtained to evaluate for foreign bodies Exploration:    Wound exploration: wound explored through full range of motion and entire depth of wound probed and visualized   Treatment:    Area cleansed with:  Saline   Amount of cleaning:  Extensive   Irrigation solution:  Sterile water   Irrigation method:  Syringe   Visualized foreign bodies/material removed: no   Skin repair:     Repair method:  Staples   Number of staples:  3 Approximation:    Approximation:  Close Post-procedure details:    Dressing:  Non-adherent dressing, sterile dressing, splint for protection and bulky dressing   Patient tolerance of procedure:  Tolerated well, no immediate complications .Marland Kitchen.Laceration Repair Date/Time: 08/17/2018 7:43 AM Performed by: Marily MemosMesner, Verlin Duke, MD Authorized by: Marily MemosMesner, Tayjah Lobdell, MD   Consent:    Consent  obtained:  Verbal   Consent given by:  Patient   Risks discussed:  Infection, need for additional repair, nerve damage, poor wound healing, poor cosmetic result, pain, retained foreign body, tendon damage and vascular damage   Alternatives discussed:  No treatment, delayed treatment and observation Anesthesia (see MAR for exact dosages):    Anesthesia method:  Local infiltration   Local anesthetic:  Lidocaine 1% w/o epi Laceration details:    Location:  Shoulder/arm   Shoulder/arm location:  R lower arm   Length (cm):  3   Depth (mm):  5 Repair type:    Repair type:  Simple Pre-procedure details:    Preparation:  Patient was prepped and draped in usual sterile fashion and imaging obtained to evaluate for foreign bodies Exploration:    Wound exploration: wound explored through full range of motion and entire depth of wound probed and visualized     Contaminated: no   Treatment:    Area cleansed with:  Saline   Amount of cleaning:  Extensive   Irrigation solution:  Sterile water   Irrigation method:  Syringe   Visualized foreign bodies/material removed: no   Skin repair:    Repair method:  Staples   Number of staples:  3 Approximation:    Approximation:  Close Post-procedure details:    Dressing:  Sterile dressing, non-adherent dressing, bulky dressing and splint for protection   Patient tolerance of procedure:  Tolerated well, no immediate complications .Marland KitchenLaceration Repair Date/Time: 08/17/2018 7:44 AM Performed by: Marily Memos, MD Authorized by:  Marily Memos, MD   Consent:    Consent obtained:  Verbal   Consent given by:  Patient   Risks discussed:  Infection, need for additional repair, nerve damage, poor wound healing, poor cosmetic result, pain, retained foreign body, tendon damage and vascular damage   Alternatives discussed:  No treatment, delayed treatment and observation Anesthesia (see MAR for exact dosages):    Anesthesia method:  Local infiltration   Local anesthetic:  Lidocaine 1% w/o epi Laceration details:    Location:  Shoulder/arm   Shoulder/arm location:  R lower arm   Length (cm):  7   Depth (mm):  10 Repair type:    Repair type:  Intermediate Pre-procedure details:    Preparation:  Patient was prepped and draped in usual sterile fashion and imaging obtained to evaluate for foreign bodies Exploration:    Wound exploration: wound explored through full range of motion and entire depth of wound probed and visualized     Wound extent: areolar tissue violated and muscle damage     Wound extent: no foreign bodies/material noted, no nerve damage noted, no tendon damage noted, no underlying fracture noted and no vascular damage noted     Contaminated: no   Treatment:    Area cleansed with:  Saline   Amount of cleaning:  Extensive   Irrigation solution:  Sterile water   Irrigation volume:  150 Skin repair:    Repair method:  Staples and sutures   Suture size:  3-0   Suture material:  Prolene   Suture technique:  Simple interrupted   Number of sutures:  1   Number of staples:  8 Approximation:    Approximation:  Close Post-procedure details:    Dressing:  Non-adherent dressing, sterile dressing, splint for protection and bulky dressing   Patient tolerance of procedure:  Tolerated with difficulty Comments:     Did not tolerate well and thus possible small defect to lower portion.    (  including critical care time)  Medications Ordered in ED Medications  LORazepam (ATIVAN) tablet 2 mg (2 mg Oral Given  08/17/18 0353)  Tdap (BOOSTRIX) injection 0.5 mL (0.5 mLs Intramuscular Given 08/17/18 0520)  lidocaine (PF) (XYLOCAINE) 1 % injection 30 mL (30 mLs Intradermal Given 08/17/18 0353)     Initial Impression / Assessment and Plan / ED Course  I have reviewed the triage vital signs and the nursing notes.  Pertinent labs & imaging results that were available during my care of the patient were reviewed by me and considered in my medical decision making (see chart for details).   Patient stated that she 'couldn't feel her hand' however on exam she was neurologically intact with equal sensation about whole hand.  Later she stated she couldn't move her arm as she held it up in the air.  It is possible that she could have an ulnar nerve injury based on the location of the lacerations however no evidence of that on exam.  Will return in 3 days for wound check and repeat neurologic exam to ensure improving or at least not worsening. Staples out in 10 days.   Final Clinical Impressions(s) / ED Diagnoses   Final diagnoses:  Laceration of right upper extremity, initial encounter    ED Discharge Orders         Ordered    HYDROcodone-acetaminophen (NORCO/VICODIN) 5-325 MG tablet  Every 6 hours PRN     08/17/18 0526           Heather Mckendree, Barbara Cower, MD 08/17/18 0745

## 2018-08-17 NOTE — ED Triage Notes (Signed)
  Patient comes in POV with R arm laceration.  Patient states she was walking and fell on a glass cup causing it to break and cut her arm.  Patient tearful and uncooperative.  Patient has 3 lacerations around R elbow with one deep.  Bleeding is controlled at this time.  Patient A&O x4.  Patient has been drinking tonight.  Pain 7/10

## 2018-08-17 NOTE — ED Notes (Signed)
Reviewed d/c instructions with pt, who verbalized understanding and had no outstanding questions. Armband & labels removed and placed in shred bin. Pt departed in NAD, refused use of wheelchair.   

## 2018-08-17 NOTE — Discharge Instructions (Signed)
You have multiple wounds to your right arm.  There is no evidence of a logic or vascular problem.  I think the weakness you are feeling in your whole arm is more related to the situation and it is an actual injury however if you notice that you have tingling, numbness or inability to move certain fingers or wrist please return the emergency department immediately.  I want somebody to look at your wounds in 3 days to make sure that they are healing well but do not expect to get your staples and stitches out for at least 10 days.

## 2018-08-17 NOTE — ED Notes (Signed)
Patient transported to X-ray 

## 2018-08-22 ENCOUNTER — Other Ambulatory Visit: Payer: Self-pay

## 2018-08-22 ENCOUNTER — Emergency Department (HOSPITAL_COMMUNITY)
Admission: EM | Admit: 2018-08-22 | Discharge: 2018-08-22 | Disposition: A | Discharge status: Home / Self Care | Payer: Self-pay | Attending: Emergency Medicine | Admitting: Emergency Medicine

## 2018-08-22 DIAGNOSIS — Z79899 Other long term (current) drug therapy: Secondary | ICD-10-CM | POA: Insufficient documentation

## 2018-08-22 DIAGNOSIS — Z4801 Encounter for change or removal of surgical wound dressing: Secondary | ICD-10-CM | POA: Insufficient documentation

## 2018-08-22 DIAGNOSIS — F17210 Nicotine dependence, cigarettes, uncomplicated: Secondary | ICD-10-CM | POA: Insufficient documentation

## 2018-08-22 DIAGNOSIS — S51811D Laceration without foreign body of right forearm, subsequent encounter: Secondary | ICD-10-CM | POA: Insufficient documentation

## 2018-08-22 DIAGNOSIS — Z5189 Encounter for other specified aftercare: Secondary | ICD-10-CM

## 2018-08-22 DIAGNOSIS — X58XXXD Exposure to other specified factors, subsequent encounter: Secondary | ICD-10-CM | POA: Insufficient documentation

## 2018-08-22 MED ORDER — BACITRACIN ZINC 500 UNIT/GM EX OINT
TOPICAL_OINTMENT | Freq: Once | CUTANEOUS | Status: AC
  Administered 2018-08-22: 17:00:00 via TOPICAL

## 2018-08-22 NOTE — ED Provider Notes (Signed)
[facility] EMERGENCY DEPARTMENT Provider Note   CSN: [id number] Arrival date & time: 08/22/18  1556    History   Chief Complaint Chief Complaint  Patient presents with  . Wound Check    HPI Sheri Spencer is a 23 y.o. female.     Patient c/o wanting wound check and dressing change. Had lac to right elbow/forearm 3 days ago. States completed course of antibiotic. Denies any increased pain or drainage from wound. No fever or chills. No numbness/weakness. Tetanus utd.   The history is provided by the patient.  Wound Check     Past Medical History:  Diagnosis Date  . Hx of chlamydia infection 2016  . Seasonal allergies     There are no active problems to display for this patient.   No past surgical history on file.   OB History    Gravida  0   Para  0   Term  0   Preterm  0   AB  0   Living  0     SAB  0   TAB  0   Ectopic  0   Multiple  0   Live Births  0            Home Medications    Prior to Admission medications   Medication Sig Start Date End Date Taking? Authorizing Provider  acetaminophen (TYLENOL) 325 MG tablet Take 325 mg by mouth every 6 (six) hours as needed for mild pain or headache.    [provider]  cyclobenzaprine (FLEXERIL) 10 MG tablet Take 1 tablet (10 mg total) by mouth 2 (two) times daily as needed for muscle spasms. 06/07/17   Tegeler, Canary Brim, MD  doxycycline (VIBRAMYCIN) 100 MG capsule Take 1 capsule (100 mg total) by mouth 2 (two) times daily. 03/13/18   Judeth Horn, NP  HYDROcodone-acetaminophen (NORCO/VICODIN) 5-325 MG tablet Take 1-2 tablets by mouth every 6 (six) hours as needed for severe pain. 08/17/18   Mesner, Barbara Cower, MD  ibuprofen (ADVIL,MOTRIN) 600 MG tablet Take 1 tablet (600 mg total) by mouth every 6 (six) hours as needed for mild pain. 10/30/17   Muthersbaugh, Dahlia Client, PA-C  metroNIDAZOLE (FLAGYL) 500 MG tablet Take 1 tablet (500 mg total) by mouth 2 (two) times daily.  03/13/18   Judeth Horn, NP    Family History No family history on file.  Social History Social History   Tobacco Use  . Smoking status: Current Every Day Smoker    Types: Cigarettes  . Smokeless tobacco: Never Used  Substance Use Topics  . Alcohol use: Yes    Comment: social  . Drug use: Yes    Types: Marijuana     Allergies   Patient has no known allergies.   Review of Systems Review of Systems  Constitutional: Negative for fever.  Musculoskeletal:       Healing lac right forearm/elbow  Skin: Positive for wound.  Neurological: Negative for numbness.     Physical Exam Updated Vital Signs BP 122/87 (BP Location: Left Arm)   Pulse 86   Temp 98.1 F (36.7 C) (Oral)   Resp 16   Ht 1.524 m (5')   Wt 68 kg   LMP 08/05/2018   SpO2 99%   BMI 29.29 kg/m   Physical Exam Vitals signs and nursing note reviewed.  Constitutional:      Appearance: Normal appearance. She is well-developed.  HENT:     Head: Atraumatic.     Nose:  Nose normal.     Mouth/Throat:     Mouth: Mucous membranes are moist.  Eyes:     General: No scleral icterus.    Conjunctiva/sclera: Conjunctivae normal.  Neck:     Musculoskeletal: No neck rigidity.     Trachea: No tracheal deviation.  Cardiovascular:     Rate and Rhythm: Normal rate.     Pulses: Normal pulses.  Pulmonary:     Effort: Pulmonary effort is normal. No respiratory distress.  Genitourinary:    Comments: No cva tenderness.  Musculoskeletal:        General: No swelling.     Comments: Healing laceration/stapled wounds right elbow/forearm, without sign of infection. Radial pulse 2+. Compartments of forearm soft, not tense, no significant sts noted.   Skin:    General: Skin is warm and dry.     Findings: No rash.  Neurological:     Mental Status: She is alert.     Comments: Alert, speech normal.   Psychiatric:        Mood and Affect: Mood normal.      ED Treatments / Results  Labs (all labs ordered are listed,  but only abnormal results are displayed) Labs Reviewed - No data to display  EKG None  Radiology No results found.  Procedures Procedures (including critical care time)  Medications Ordered in ED Medications  bacitracin ointment (has no administration in time range)     Initial Impression / Assessment and Plan / ED Course  I have reviewed the triage vital signs and the nursing notes.  Pertinent labs & imaging results that were available during my care of the patient were reviewed by me and considered in my medical decision making (see chart for details).  Old dressing discarded.   Bacitracin and new dressing applied.   Staple removal in 1 week.   Reviewed nursing notes and prior charts for additional history.     Final Clinical Impressions(s) / ED Diagnoses   Final diagnoses:  None    ED Discharge Orders    None       Cathren Laine, MD 08/22/18 [phone]

## 2018-08-22 NOTE — Discharge Instructions (Addendum)
It was our pleasure to provide your ER care today - we hope that you feel better.  Have staples removed, your doctor or urgent care, in 1 week.  Continue to keep area very clean.   Return if worse, infection of wound (pus, spreading redness, severe pain), or other concern.

## 2018-08-22 NOTE — ED Triage Notes (Signed)
Pt had laceration to right arm and had sutures placed. Here for wound check. 7/10 pain to right arm

## 2018-09-02 ENCOUNTER — Other Ambulatory Visit: Payer: Self-pay

## 2018-09-02 ENCOUNTER — Ambulatory Visit (HOSPITAL_COMMUNITY)
Admission: EM | Admit: 2018-09-02 | Discharge: 2018-09-02 | Disposition: A | Discharge status: Home / Self Care | Payer: Self-pay

## 2018-09-02 ENCOUNTER — Encounter (HOSPITAL_COMMUNITY): Payer: Self-pay | Admitting: Emergency Medicine

## 2018-09-02 DIAGNOSIS — Z4802 Encounter for removal of sutures: Secondary | ICD-10-CM

## 2018-09-02 NOTE — ED Triage Notes (Signed)
Pt here for staple and suture removal; 14 staples removed for multiple wounds to right elbow and 1 suture; pt had some difficulty tolerating but managed to complete; SN looked at wound as some purulent discharge noted

## 2018-11-06 ENCOUNTER — Other Ambulatory Visit: Payer: Self-pay

## 2018-11-06 ENCOUNTER — Emergency Department (HOSPITAL_COMMUNITY): Payer: Self-pay

## 2018-11-06 ENCOUNTER — Emergency Department (HOSPITAL_COMMUNITY)
Admission: EM | Admit: 2018-11-06 | Discharge: 2018-11-07 | Disposition: A | Discharge status: Home / Self Care | Payer: Self-pay | Attending: Emergency Medicine | Admitting: Emergency Medicine

## 2018-11-06 ENCOUNTER — Encounter (HOSPITAL_COMMUNITY): Payer: Self-pay | Admitting: Emergency Medicine

## 2018-11-06 DIAGNOSIS — F17210 Nicotine dependence, cigarettes, uncomplicated: Secondary | ICD-10-CM | POA: Insufficient documentation

## 2018-11-06 DIAGNOSIS — R0602 Shortness of breath: Secondary | ICD-10-CM | POA: Insufficient documentation

## 2018-11-06 DIAGNOSIS — R1012 Left upper quadrant pain: Secondary | ICD-10-CM | POA: Insufficient documentation

## 2018-11-06 DIAGNOSIS — Z20828 Contact with and (suspected) exposure to other viral communicable diseases: Secondary | ICD-10-CM | POA: Insufficient documentation

## 2018-11-06 LAB — COMPREHENSIVE METABOLIC PANEL
ALT: 12 U/L (ref 0–44)
AST: 23 U/L (ref 15–41)
Albumin: 3.9 g/dL (ref 3.5–5.0)
Alkaline Phosphatase: 98 U/L (ref 38–126)
Anion gap: 11 (ref 5–15)
BUN: 7 mg/dL (ref 6–20)
CO2: 22 mmol/L (ref 22–32)
Calcium: 9.1 mg/dL (ref 8.9–10.3)
Chloride: 102 mmol/L (ref 98–111)
Creatinine, Ser: 0.71 mg/dL (ref 0.44–1.00)
GFR calc Af Amer: >60 mL/min (ref >60)
GFR calc non Af Amer: >60 mL/min (ref >60)
Glucose, Bld: 104 mg/dL — ABNORMAL HIGH (ref 70–99)
Potassium: 4.1 mmol/L (ref 3.5–5.1)
Sodium: 135 mmol/L (ref 135–145)
Total Bilirubin: 0.5 mg/dL (ref 0.3–1.2)
Total Protein: 8.8 g/dL — ABNORMAL HIGH (ref 6.5–8.1)

## 2018-11-06 LAB — URINALYSIS, ROUTINE W REFLEX MICROSCOPIC
Bacteria, UA: NONE SEEN
Bilirubin Urine: NEGATIVE
Glucose, UA: NEGATIVE mg/dL
Hgb urine dipstick: NEGATIVE
Ketones, ur: NEGATIVE mg/dL
Nitrite: NEGATIVE
Protein, ur: NEGATIVE mg/dL
Specific Gravity, Urine: 1.015 (ref 1.005–1.030)
pH: 7 (ref 5.0–8.0)

## 2018-11-06 LAB — CBC
HCT: 41.5 % (ref 36.0–46.0)
Hemoglobin: 13.7 g/dL (ref 12.0–15.0)
MCH: 31.1 pg (ref 26.0–34.0)
MCHC: 33 g/dL (ref 30.0–36.0)
MCV: 94.3 fL (ref 80.0–100.0)
Platelets: 429 10*3/uL — ABNORMAL HIGH (ref 150–400)
RBC: 4.4 MIL/uL (ref 3.87–5.11)
RDW: 13.3 % (ref 11.5–15.5)
WBC: 12.2 10*3/uL — ABNORMAL HIGH (ref 4.0–10.5)
nRBC: 0 % (ref 0.0–0.2)

## 2018-11-06 LAB — I-STAT BETA HCG BLOOD, ED (NOT ORDERABLE): I-stat hCG, quantitative: <5 m[IU]/mL (ref <5)

## 2018-11-06 LAB — D-DIMER, QUANTITATIVE: D-Dimer, Quant: 2.39 ug/mL-FEU — ABNORMAL HIGH (ref 0.00–0.50)

## 2018-11-06 LAB — LIPASE, BLOOD: Lipase: 20 U/L (ref 11–51)

## 2018-11-06 MED ORDER — KETOROLAC TROMETHAMINE 30 MG/ML IJ SOLN
30.0000 mg | Freq: Once | INTRAMUSCULAR | Status: AC
  Administered 2018-11-06: 30 mg via INTRAVENOUS
  Filled 2018-11-06: qty 1

## 2018-11-06 MED ORDER — SODIUM CHLORIDE (PF) 0.9 % IJ SOLN
INTRAMUSCULAR | Status: AC
  Filled 2018-11-06: qty 50

## 2018-11-06 NOTE — ED Notes (Signed)
Provider at bedside

## 2018-11-06 NOTE — ED Notes (Signed)
Urine and culture sent to lab  

## 2018-11-06 NOTE — ED Notes (Signed)
Pt aware that urine sample is needed.  

## 2018-11-06 NOTE — ED Provider Notes (Signed)
[facility]-EMERGENCY DEPT Provider Note   CSN: [id number] Arrival date & time: 11/06/18  2031    History   Chief Complaint Chief Complaint  Patient presents with  . Shortness of Breath  . Flank Pain    HPI Sheri Spencer is a 23 y.o. female.     HPI Pt states she started to feel short of breath today.  When she breathes deeply it starts to hurt. Pt denies cough, nasal congestion or sore throat.  No fever or vomiting or diarrhea.  No leg swelling. No history of asthma.  Patient initially denied abdominal pain but then indicated she was having some discomfort in her left upper abdomen. Past Medical History:  Diagnosis Date  . Hx of chlamydia infection 2016  . Seasonal allergies     There are no active problems to display for this patient.   History reviewed. No pertinent surgical history.   OB History    Gravida  0   Para  0   Term  0   Preterm  0   AB  0   Living  0     SAB  0   TAB  0   Ectopic  0   Multiple  0   Live Births  0            Home Medications    Prior to Admission medications   Medication Sig Start Date End Date Taking? Authorizing Provider  acetaminophen (TYLENOL) 325 MG tablet Take 325 mg by mouth every 6 (six) hours as needed for mild pain or headache.   Yes [provider]  Acetaminophen-Caff-Pyrilamine (MIDOL COMPLETE PO) Take 2 tablets by mouth every 4 (four) hours as needed (cramping).   Yes [provider]  cyclobenzaprine (FLEXERIL) 10 MG tablet Take 1 tablet (10 mg total) by mouth 2 (two) times daily as needed for muscle spasms. Patient not taking: Reported on 11/06/2018 06/07/17   Tegeler, Canary Brimhristopher J, MD  doxycycline (VIBRAMYCIN) 100 MG capsule Take 1 capsule (100 mg total) by mouth 2 (two) times daily. Patient not taking: Reported on 11/06/2018 03/13/18   Judeth HornLawrence, Erin, NP  HYDROcodone-acetaminophen (NORCO/VICODIN) 5-325 MG tablet Take 1-2 tablets by mouth every 6 (six) hours as  needed for severe pain. Patient not taking: Reported on 11/06/2018 08/17/18   Mesner, Barbara CowerJason, MD  ibuprofen (ADVIL,MOTRIN) 600 MG tablet Take 1 tablet (600 mg total) by mouth every 6 (six) hours as needed for mild pain. Patient not taking: Reported on 11/06/2018 10/30/17   Muthersbaugh, Dahlia ClientHannah, PA-C  metroNIDAZOLE (FLAGYL) 500 MG tablet Take 1 tablet (500 mg total) by mouth 2 (two) times daily. Patient not taking: Reported on 11/06/2018 03/13/18   Judeth HornLawrence, Erin, NP    Family History History reviewed. No pertinent family history.  Social History Social History   Tobacco Use  . Smoking status: Current Every Day Smoker    Types: Cigarettes  . Smokeless tobacco: Never Used  Substance Use Topics  . Alcohol use: Yes    Comment: social  . Drug use: Yes    Types: Marijuana     Allergies   Patient has no known allergies.   Review of Systems Review of Systems  All other systems reviewed and are negative.    Physical Exam Updated Vital Signs BP (!) 121/93 (BP Location: Left Arm)   Pulse 100   Temp 99.6 F (37.6 C) (Oral)   Resp 18   Ht 1.524 m (5')   Wt 68 kg  LMP 11/06/2018   SpO2 98%   BMI 29.29 kg/m   Physical Exam Vitals signs and nursing note reviewed.  Constitutional:      General: She is not in acute distress.    Appearance: She is well-developed.  HENT:     Head: Normocephalic and atraumatic.     Right Ear: External ear normal.     Left Ear: External ear normal.  Eyes:     General: No scleral icterus.       Right eye: No discharge.        Left eye: No discharge.     Conjunctiva/sclera: Conjunctivae normal.  Neck:     Musculoskeletal: Neck supple.     Trachea: No tracheal deviation.  Cardiovascular:     Rate and Rhythm: Normal rate and regular rhythm.  Pulmonary:     Effort: Pulmonary effort is normal. No respiratory distress.     Breath sounds: Normal breath sounds. No stridor. No wheezing or rales.  Abdominal:     General: Bowel sounds are normal.  There is no distension.     Palpations: Abdomen is soft.     Tenderness: There is no abdominal tenderness. There is no guarding or rebound.     Comments: llt luq  Musculoskeletal:        General: No tenderness.  Skin:    General: Skin is warm and dry.     Findings: No rash.  Neurological:     Mental Status: She is alert.     Cranial Nerves: No cranial nerve deficit (no facial droop, extraocular movements intact, no slurred speech).     Sensory: No sensory deficit.     Motor: No abnormal muscle tone or seizure activity.     Coordination: Coordination normal.      ED Treatments / Results  Labs (all labs ordered are listed, but only abnormal results are displayed) Labs Reviewed  CBC - Abnormal; Notable for the following components:      Result Value   WBC 12.2 (*)    Platelets 429 (*)    All other components within normal limits  URINALYSIS, ROUTINE W REFLEX MICROSCOPIC - Abnormal; Notable for the following components:   APPearance HAZY (*)    Leukocytes,Ua TRACE (*)    All other components within normal limits  D-DIMER, QUANTITATIVE (NOT AT [facility]) - Abnormal; Notable for the following components:   D-Dimer, Quant 2.39 (*)    All other components within normal limits  COMPREHENSIVE METABOLIC PANEL  LIPASE, BLOOD  I-STAT BETA HCG BLOOD, ED ([facility], [facility], [facility] ONLY)  I-STAT BETA HCG BLOOD, ED (NOT ORDERABLE)    EKG EKG Interpretation  Date/Time:  Wednesday November 06 2018 22:11:19 EDT Ventricular Rate:  98 PR Interval:    QRS Duration: 79 QT Interval:  318 QTC Calculation: 406 R Axis:   84 Text Interpretation:  Sinus rhythm No significant change since last tracing Confirmed by Dorie Rank [phone]) on 11/06/2018 10:38:01 PM   Radiology Dg Chest 2 View  Result Date: 11/06/2018 CLINICAL DATA:  Shortness of breath EXAM: CHEST - 2 VIEW COMPARISON:  06/07/2017 FINDINGS: The heart size and mediastinal contours are within normal limits. Both lungs are clear. The visualized skeletal  structures are unremarkable. IMPRESSION: No active cardiopulmonary disease. Electronically Signed   By: Constance Holster M.D.   On: 11/06/2018 22:08    Procedures Procedures (including critical care time)  Medications Ordered in ED Medications  ketorolac (TORADOL) 30 MG/ML injection 30 mg (30 mg Intravenous Given  11/06/18 2246)     Initial Impression / Assessment and Plan / ED Course  I have reviewed the triage vital signs and the nursing notes.  Pertinent labs & imaging results that were available during my care of the patient were reviewed by me and considered in my medical decision making (see chart for details).    Patient presented to the emergency room with complaints of dyspnea.  Patient does have an elevated d-dimer.  With her dyspnea we will plan on CT angiogram to evaluate for pulmonary embolism.  Care turned over to Dr. Read DriversMolpus.  Final Clinical Impressions(s) / ED Diagnoses  pending   Linwood DibblesKnapp, Brynna Dobos, MD 11/06/18 2303

## 2018-11-07 ENCOUNTER — Emergency Department (HOSPITAL_COMMUNITY): Payer: Self-pay

## 2018-11-07 LAB — SARS CORONAVIRUS 2 BY RT PCR (HOSPITAL ORDER, PERFORMED IN ~~LOC~~ HOSPITAL LAB): SARS Coronavirus 2: NEGATIVE

## 2018-11-07 MED ORDER — AEROCHAMBER Z-STAT PLUS/MEDIUM MISC
Status: AC
  Filled 2018-11-07: qty 1

## 2018-11-07 MED ORDER — ALBUTEROL SULFATE HFA 108 (90 BASE) MCG/ACT IN AERS
INHALATION_SPRAY | RESPIRATORY_TRACT | Status: AC
  Filled 2018-11-07: qty 6.7

## 2018-11-07 MED ORDER — IOHEXOL 350 MG/ML SOLN
100.0000 mL | Freq: Once | INTRAVENOUS | Status: AC | PRN
  Administered 2018-11-07: 100 mL via INTRAVENOUS

## 2018-11-07 NOTE — ED Notes (Addendum)
*  DOWNTIME COMPLETE*. Please refer to paper chart for information. Pt verbalized discharge instructions and follow up care. Alert and ambulatory. No iv

## 2018-11-07 NOTE — ED Notes (Signed)
*  DOWNTIME BEGAN*

## 2018-11-07 NOTE — ED Provider Notes (Signed)
Nursing notes and vitals signs, including pulse oximetry, reviewed.  Summary of this visit's results, reviewed by myself:  EKG:  EKG Interpretation  Date/Time:  Wednesday November 06 2018 22:11:19 EDT Ventricular Rate:  98 PR Interval:    QRS Duration: 79 QT Interval:  318 QTC Calculation: 406 R Axis:   84 Text Interpretation:  Sinus rhythm No significant change since last tracing Confirmed by Linwood DibblesKnapp, Jon [phone]([id number]) on 11/06/2018 10:38:01 PM       Labs:  Results for orders placed or performed during the hospital encounter of 11/06/18 (from the past 24 hour(s))  Urinalysis, Routine w reflex microscopic     Status: Abnormal   Collection Time: 11/06/18 10:29 PM  Result Value Ref Range   Color, Urine YELLOW YELLOW   APPearance HAZY (A) CLEAR   Specific Gravity, Urine 1.015 1.005 - 1.030   pH 7.0 5.0 - 8.0   Glucose, UA NEGATIVE NEGATIVE mg/dL   Hgb urine dipstick NEGATIVE NEGATIVE   Bilirubin Urine NEGATIVE NEGATIVE   Ketones, ur NEGATIVE NEGATIVE mg/dL   Protein, ur NEGATIVE NEGATIVE mg/dL   Nitrite NEGATIVE NEGATIVE   Leukocytes,Ua TRACE (A) NEGATIVE   RBC / HPF 0-5 0 - 5 RBC/hpf   WBC, UA 21-50 0 - 5 WBC/hpf   Bacteria, UA NONE SEEN NONE SEEN   Squamous Epithelial / LPF 0-5 0 - 5   Mucus PRESENT    Amorphous Crystal PRESENT   CBC     Status: Abnormal   Collection Time: 11/06/18 10:38 PM  Result Value Ref Range   WBC 12.2 (H) 4.0 - 10.5 K/uL   RBC 4.40 3.87 - 5.11 MIL/uL   Hemoglobin 13.7 12.0 - 15.0 g/dL   HCT 21.341.5 08.636.0 - 57.846.0 %   MCV 94.3 80.0 - 100.0 fL   MCH 31.1 26.0 - 34.0 pg   MCHC 33.0 30.0 - 36.0 g/dL   RDW 46.913.3 62.911.5 - 52.815.5 %   Platelets 429 (H) 150 - 400 K/uL   nRBC 0.0 0.0 - 0.2 %  D-dimer, quantitative (not at [facility])     Status: Abnormal   Collection Time: 11/06/18 10:38 PM  Result Value Ref Range   D-Dimer, Quant 2.39 (H) 0.00 - 0.50 ug/mL-FEU  Comprehensive metabolic panel     Status: Abnormal   Collection Time: 11/06/18 10:38 PM  Result Value Ref Range    Sodium 135 135 - 145 mmol/L   Potassium 4.1 3.5 - 5.1 mmol/L   Chloride 102 98 - 111 mmol/L   CO2 22 22 - 32 mmol/L   Glucose, Bld 104 (H) 70 - 99 mg/dL   BUN 7 6 - 20 mg/dL   Creatinine, Ser 4.130.71 0.44 - 1.00 mg/dL   Calcium 9.1 8.9 - 24.410.3 mg/dL   Total Protein 8.8 (H) 6.5 - 8.1 g/dL   Albumin 3.9 3.5 - 5.0 g/dL   AST 23 15 - 41 U/L   ALT 12 0 - 44 U/L   Alkaline Phosphatase 98 38 - 126 U/L   Total Bilirubin 0.5 0.3 - 1.2 mg/dL   GFR calc non Af Amer >60 >60 mL/min   GFR calc Af Amer >60 >60 mL/min   Anion gap 11 5 - 15  Lipase, blood     Status: None   Collection Time: 11/06/18 10:38 PM  Result Value Ref Range   Lipase 20 11 - 51 U/L  I-Stat beta hCG blood, ED     Status: None   Collection Time: 11/06/18 10:44 PM  Result Value Ref Range   I-stat hCG, quantitative <5.0 <5 mIU/mL   Comment 3          SARS Coronavirus 2 (CEPHEID- Performed in [place] hospital lab), Hosp Order     Status: None   Collection Time: 11/06/18 11:24 PM   Specimen: Nasopharyngeal Swab  Result Value Ref Range   SARS Coronavirus 2 NEGATIVE NEGATIVE    Imaging Studies: Dg Chest 2 View  Result Date: 11/06/2018 CLINICAL DATA:  Shortness of breath EXAM: CHEST - 2 VIEW COMPARISON:  06/07/2017 FINDINGS: The heart size and mediastinal contours are within normal limits. Both lungs are clear. The visualized skeletal structures are unremarkable. IMPRESSION: No active cardiopulmonary disease. Electronically Signed   By: Constance Holster M.D.   On: 11/06/2018 22:08   Ct Angio Chest Pe W And/or Wo Contrast  Result Date: 11/07/2018 CLINICAL DATA:  23 year old female with abnormal D-dimer. Shortness of breath. EXAM: CT ANGIOGRAPHY CHEST WITH CONTRAST TECHNIQUE: Multidetector CT imaging of the chest was performed using the standard protocol during bolus administration of intravenous contrast. Multiplanar CT image reconstructions and MIPs were obtained to evaluate the vascular anatomy. CONTRAST:  100 milliliters  Omnipaque 300 COMPARISON:  Chest radiograph 11/06/2018. FINDINGS: Cardiovascular: Good contrast bolus timing in the pulmonary arterial tree. Mild respiratory motion at the lung bases. No focal filling defect identified in the pulmonary arteries to suggest acute pulmonary embolism. No calcified coronary artery atherosclerosis is evident. No cardiomegaly. Negative visible aorta. Mediastinum/Nodes: Negative; small volume residual thymus. Lungs/Pleura: Major airways are patent. No abnormal pulmonary opacity. No pleural effusion. Upper Abdomen: Negative visible liver, spleen, pancreas, adrenal glands, kidneys, and bowel. Musculoskeletal: Negative. Review of the MIP images confirms the above findings. IMPRESSION: Negative for acute pulmonary embolus. Negative chest CTA. Electronically Signed   By: Genevie Ann M.D.   On: 11/07/2018 01:45   3:09 AM Patient CT unremarkable, negative for COVID-19.  Patient states she feels better after Toradol.  Lung sounds diminished, will give patient an albuterol inhaler with [employer] and instruct her in its use.     Jester Klingberg, Jenny Reichmann, MD 11/07/18 [phone]

## 2019-10-24 IMAGING — CT CT ANGIOGRAPHY CHEST
2 of 6 series · 19 of 46 positions shown · IV contrast (OMNIPAQUE)
Comparison: Chest radiograph 11/06/2018.

CLINICAL DATA: 22-year-old female with abnormal D-dimer. Shortness
of breath.

EXAM:
CT ANGIOGRAPHY CHEST WITH CONTRAST
TECHNIQUE: Multidetector CT imaging of the chest was performed using the
standard protocol during bolus administration of intravenous
contrast. Multiplanar CT image reconstructions and MIPs were
obtained to evaluate the vascular anatomy.
CONTRAST:  100 milliliters Omnipaque 300

[Series 7: thins · axial · 0.62mm/px · z∈[+1368,+1597]mm · 16 of 251 slices shown]
[im 11/251  lung]
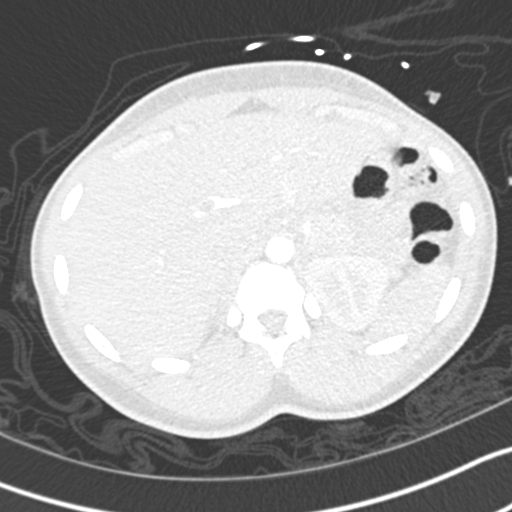
[im 33/251  soft-tissue]
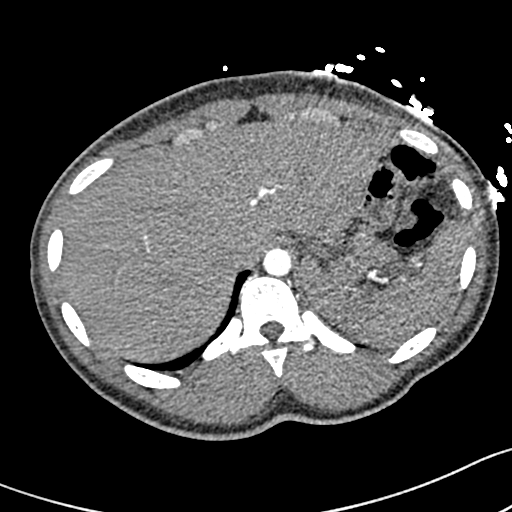
[im 44/251  lung]
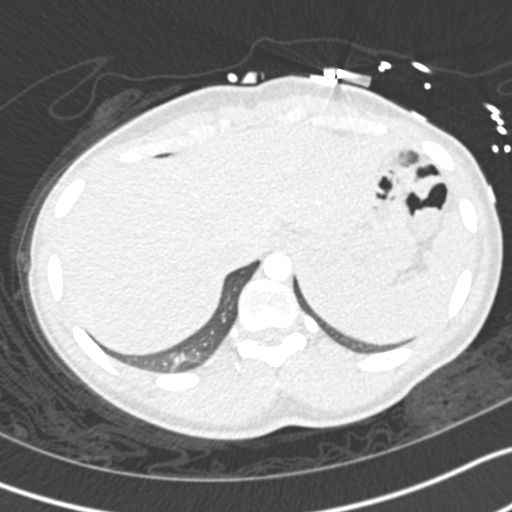
[im 55/251  soft-tissue]
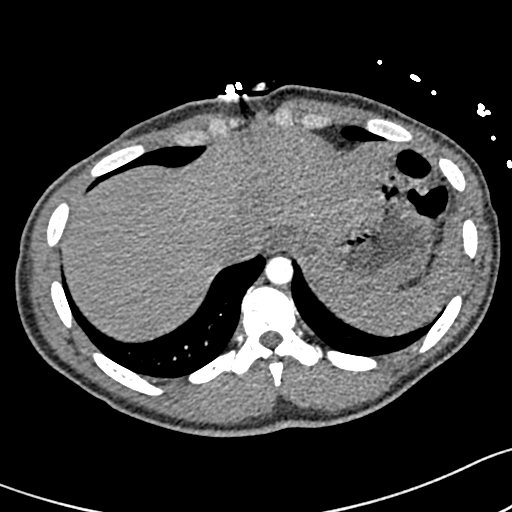
[im 77/251  lung]
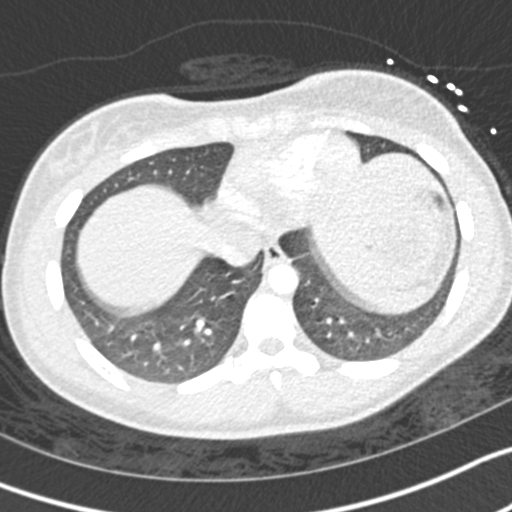
[im 87/251  soft-tissue]
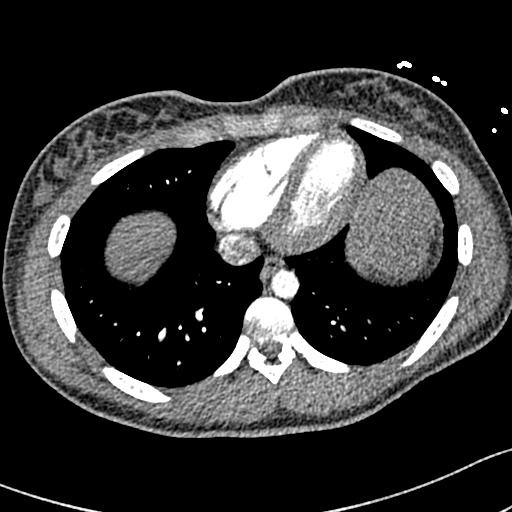
[im 98/251  lung]
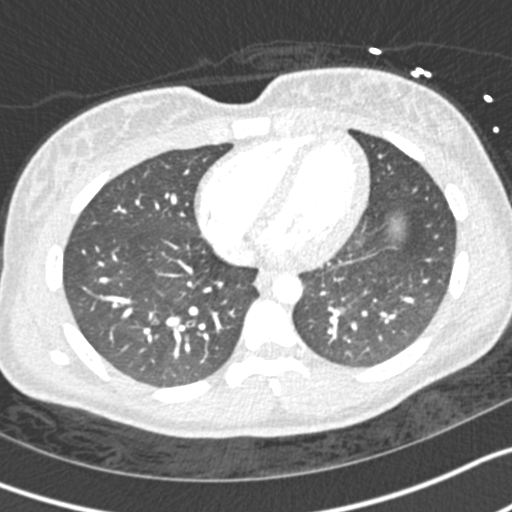
[im 120/251  soft-tissue]
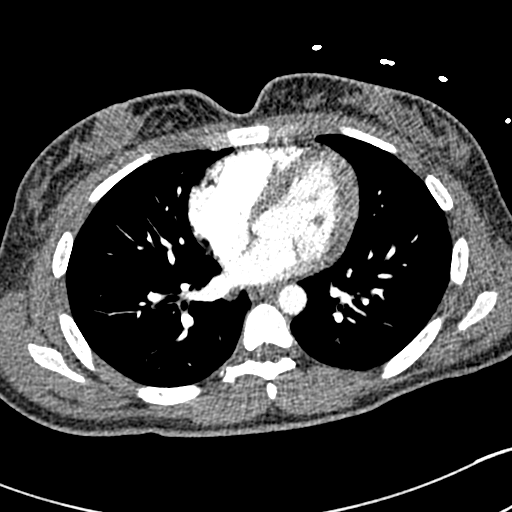
[im 131/251  lung]
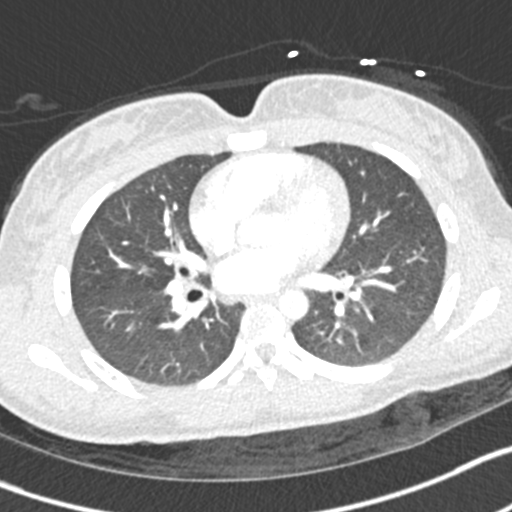
[im 153/251  soft-tissue]
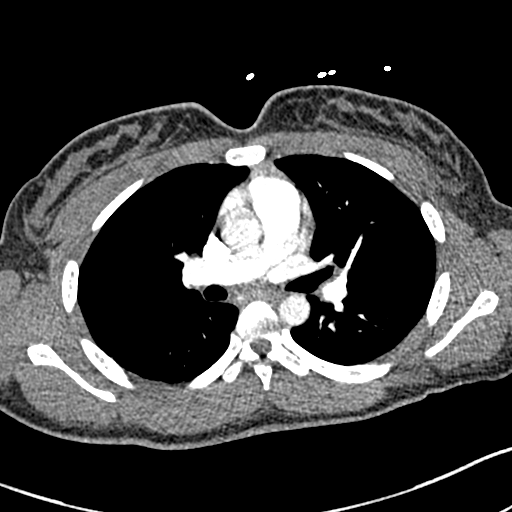
[im 164/251  lung]
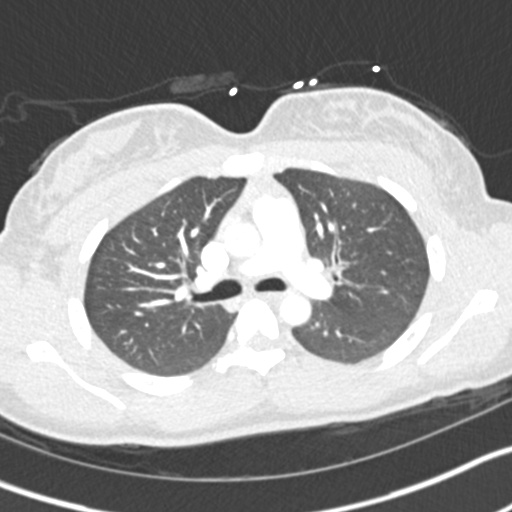
[im 174/251  soft-tissue]
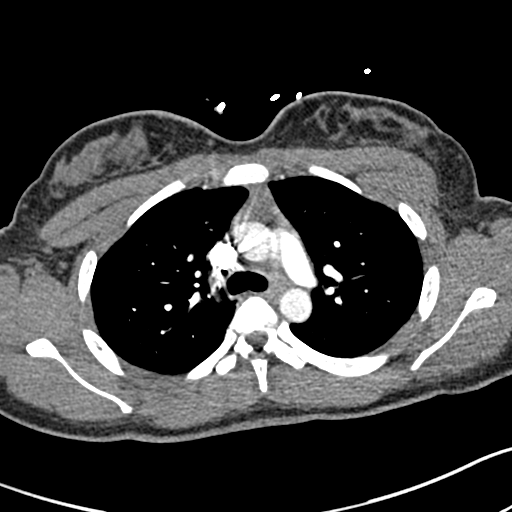
[im 196/251  lung]
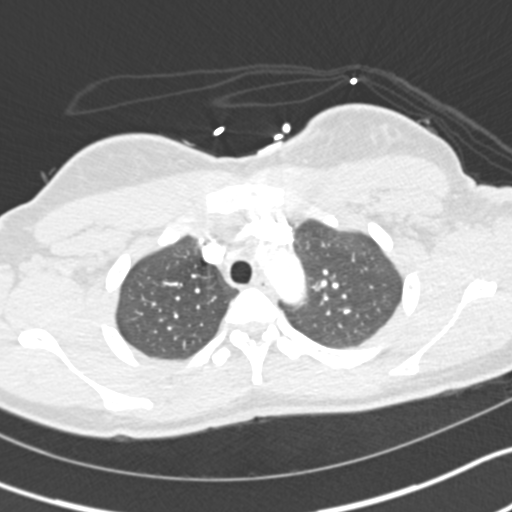
[im 207/251  soft-tissue]
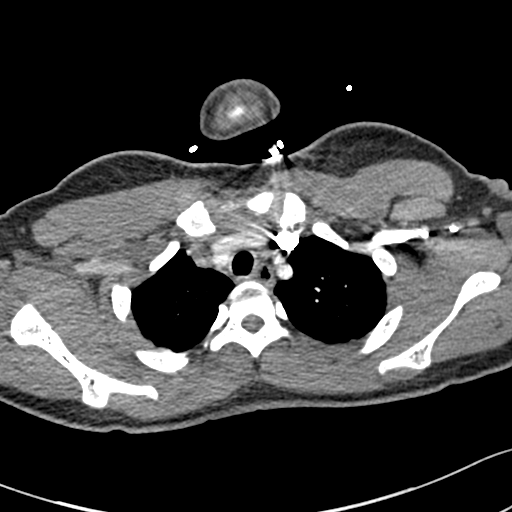
[im 218/251  lung]
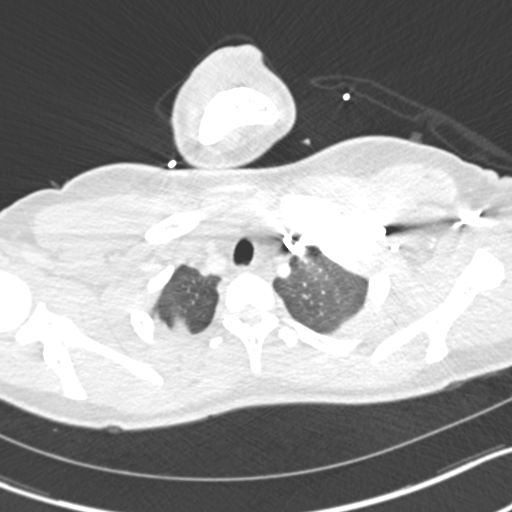
[im 240/251  soft-tissue]
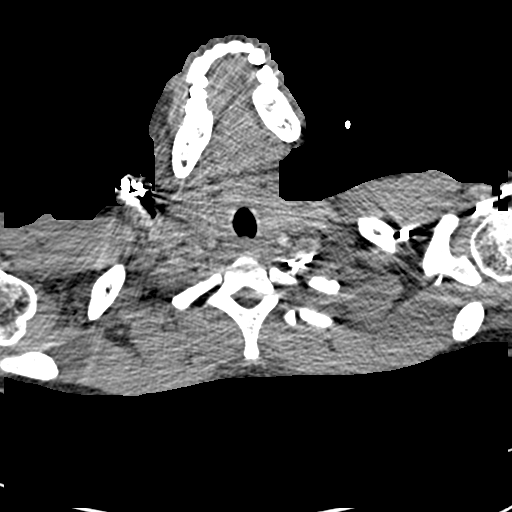

[Series 9: coronal mpr · coronal · 0.52mm/px · 3 of 104 slices shown]
[im 26/104  soft-tissue]
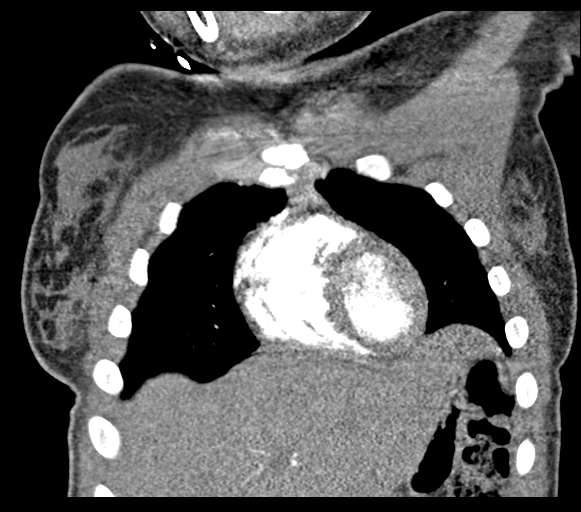
[im 52/104  soft-tissue]
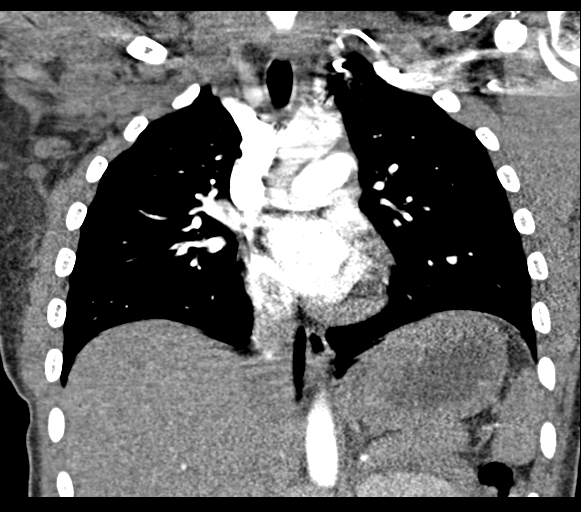
[im 78/104  soft-tissue]
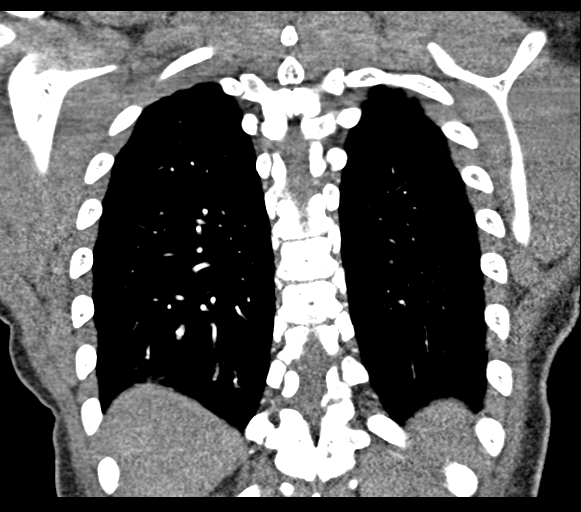

[19 of 46 positions shown; findings below may reference images not displayed]

FINDINGS: Cardiovascular: Good contrast bolus timing in the pulmonary arterial
tree. Mild respiratory motion at the lung bases.

No focal filling defect identified in the pulmonary arteries to
suggest acute pulmonary embolism.

No calcified coronary artery atherosclerosis is evident. No
cardiomegaly. Negative visible aorta.

Mediastinum/Nodes: Negative; small volume residual thymus.

Lungs/Pleura: Major airways are patent. No abnormal pulmonary
opacity. No pleural effusion.

Upper Abdomen: Negative visible liver, spleen, pancreas, adrenal
glands, kidneys, and bowel.

Musculoskeletal: Negative.

Review of the MIP images confirms the above findings.
IMPRESSION: Negative for acute pulmonary embolus. Negative chest CTA.

## 2022-05-24 ENCOUNTER — Telehealth: Payer: Self-pay

## 2022-05-24 NOTE — Telephone Encounter (Signed)
LVM. Sent mychart msg. AS, CMA

## 2022-07-19 DIAGNOSIS — N760 Acute vaginitis: Secondary | ICD-10-CM | POA: Diagnosis not present

## 2022-07-19 DIAGNOSIS — N766 Ulceration of vulva: Secondary | ICD-10-CM | POA: Diagnosis not present

## 2022-07-19 DIAGNOSIS — Z113 Encounter for screening for infections with a predominantly sexual mode of transmission: Secondary | ICD-10-CM | POA: Diagnosis not present

## 2022-07-19 DIAGNOSIS — Z114 Encounter for screening for human immunodeficiency virus [HIV]: Secondary | ICD-10-CM | POA: Diagnosis not present

## 2022-07-24 ENCOUNTER — Emergency Department (HOSPITAL_COMMUNITY): Payer: BC Managed Care – PPO

## 2022-07-24 ENCOUNTER — Emergency Department (HOSPITAL_COMMUNITY)
Admission: EM | Admit: 2022-07-24 | Discharge: 2022-07-24 | Disposition: A | Discharge status: Home / Self Care | Payer: BC Managed Care – PPO | Attending: Emergency Medicine | Admitting: Emergency Medicine

## 2022-07-24 ENCOUNTER — Other Ambulatory Visit: Payer: Self-pay

## 2022-07-24 DIAGNOSIS — I160 Hypertensive urgency: Secondary | ICD-10-CM | POA: Diagnosis not present

## 2022-07-24 DIAGNOSIS — I10 Essential (primary) hypertension: Secondary | ICD-10-CM | POA: Diagnosis present

## 2022-07-24 LAB — CBC WITH DIFFERENTIAL/PLATELET
Abs Immature Granulocytes: 0.02 10*3/uL (ref 0.00–0.07)
Basophils Absolute: 0 10*3/uL (ref 0.0–0.1)
Basophils Relative: 0 %
Eosinophils Absolute: 0 10*3/uL (ref 0.0–0.5)
Eosinophils Relative: 0 %
HCT: 41.9 % (ref 36.0–46.0)
Hemoglobin: 14.3 g/dL (ref 12.0–15.0)
Immature Granulocytes: 0 %
Lymphocytes Relative: 15 %
Lymphs Abs: 1 10*3/uL (ref 0.7–4.0)
MCH: 32 pg (ref 26.0–34.0)
MCHC: 34.1 g/dL (ref 30.0–36.0)
MCV: 93.7 fL (ref 80.0–100.0)
Monocytes Absolute: 0.7 10*3/uL (ref 0.1–1.0)
Monocytes Relative: 10 %
Neutro Abs: 4.8 10*3/uL (ref 1.7–7.7)
Neutrophils Relative %: 75 %
Platelets: 371 10*3/uL (ref 150–400)
RBC: 4.47 MIL/uL (ref 3.87–5.11)
RDW: 13.9 % (ref 11.5–15.5)
WBC: 6.5 10*3/uL (ref 4.0–10.5)
nRBC: 0 % (ref 0.0–0.2)

## 2022-07-24 LAB — BASIC METABOLIC PANEL
Anion gap: 14 (ref 5–15)
BUN: 15 mg/dL (ref 6–20)
CO2: 22 mmol/L (ref 22–32)
Calcium: 9.2 mg/dL (ref 8.9–10.3)
Chloride: 95 mmol/L — ABNORMAL LOW (ref 98–111)
Creatinine, Ser: 1.02 mg/dL — ABNORMAL HIGH (ref 0.44–1.00)
GFR, Estimated: >60 mL/min (ref >60)
Glucose, Bld: 158 mg/dL — ABNORMAL HIGH (ref 70–99)
Potassium: 4.1 mmol/L (ref 3.5–5.1)
Sodium: 131 mmol/L — ABNORMAL LOW (ref 135–145)

## 2022-07-24 LAB — I-STAT BETA HCG BLOOD, ED (MC, WL, AP ONLY): I-stat hCG, quantitative: <5 m[IU]/mL (ref <5)

## 2022-07-24 LAB — BRAIN NATRIURETIC PEPTIDE: B Natriuretic Peptide: 49.9 pg/mL (ref 0.0–100.0)

## 2022-07-24 NOTE — ED Triage Notes (Signed)
Patient here for hypertension found at home earlier today, reported systolic pressure of 0000000 at home. Reports headache last night and nausea. Patient denies history of hypertension, is alert, oriented, and in no apparent distress at this time.

## 2022-07-24 NOTE — Discharge Instructions (Addendum)
Your blood pressure is elevated today in the emergency department.  If you develop headache, chest pain, trouble breathing, vision changes, stroke symptoms such as weakness or numbness or speech difficulty, or any other new/concerning symptoms then return to the ER or call 911.  Otherwise it is important to follow-up with a primary care physician for outpatient monitoring and treatment. Until then you can increase exercise, change your diet and lose weight to help with high blood pressure.

## 2022-07-24 NOTE — ED Provider Notes (Signed)
[facility] Provider Note   CSN: [id number] Arrival date & time: 07/24/22  1430     History  Chief Complaint  Patient presents with   Hypertension    Sheri Spencer is a 27 y.o. female.  HPI 27 year old female with a history of hypertension presents with concern for hypertension.  She states that she was vomiting all day yesterday.  Previous to that she states she drank heavily on the evening of 2/24.  She started getting some palpitations which she described as high heart rate but no irregular heart rate in the evening of 2/25 and this morning.  She was transiently short of breath last night/this morning but that is gone.  She had a headache that was pretty bad this morning as well but that is also gone.  No further vomiting and no abdominal pain.  Checked her blood pressure and was around 160/90 today which prompted her to come to the ER.  Otherwise right now she feels normal.  She has been told she has high blood pressure in the past from previous hospital visits but does not have a PCP and is not on any medicines.  She denies any illicit drug use though does use marijuana.  She drinks alcohol heavily on the weekends but not during the week.  Home Medications Prior to Admission medications   Medication Sig Start Date End Date Taking? Authorizing Provider  acetaminophen (TYLENOL) 325 MG tablet Take 325 mg by mouth every 6 (six) hours as needed for mild pain or headache.    [provider]  Acetaminophen-Caff-Pyrilamine (MIDOL COMPLETE PO) Take 2 tablets by mouth every 4 (four) hours as needed (cramping).    [provider]  cyclobenzaprine (FLEXERIL) 10 MG tablet Take 1 tablet (10 mg total) by mouth 2 (two) times daily as needed for muscle spasms. Patient not taking: Reported on 11/06/2018 06/07/17   Tegeler, Gwenyth Allegra, MD  doxycycline (VIBRAMYCIN) 100 MG capsule Take 1 capsule (100 mg total) by mouth 2 (two) times  daily. Patient not taking: Reported on 11/06/2018 03/13/18   Jorje Guild, NP  HYDROcodone-acetaminophen (NORCO/VICODIN) 5-325 MG tablet Take 1-2 tablets by mouth every 6 (six) hours as needed for severe pain. Patient not taking: Reported on 11/06/2018 08/17/18   Mesner, Corene Cornea, MD  ibuprofen (ADVIL,MOTRIN) 600 MG tablet Take 1 tablet (600 mg total) by mouth every 6 (six) hours as needed for mild pain. Patient not taking: Reported on 11/06/2018 10/30/17   Muthersbaugh, Jarrett Soho, PA-C  metroNIDAZOLE (FLAGYL) 500 MG tablet Take 1 tablet (500 mg total) by mouth 2 (two) times daily. Patient not taking: Reported on 11/06/2018 03/13/18   Jorje Guild, NP      Allergies    Patient has no known allergies.    Review of Systems   Review of Systems  Respiratory:  Positive for shortness of breath.   Cardiovascular:  Positive for palpitations. Negative for chest pain.  Gastrointestinal:  Positive for vomiting. Negative for abdominal pain.  Neurological:  Positive for headaches.    Physical Exam Updated Vital Signs BP (!) 150/102 (BP Location: Right Arm)   Pulse 92   Temp 97.9 F (36.6 C) (Oral)   Resp 13   Ht 5' (1.524 m)   Wt 73.9 kg   LMP 07/22/2022   SpO2 99%   BMI 31.83 kg/m  Physical Exam Vitals and nursing note reviewed.  Constitutional:      General: She is not in acute distress.  Appearance: She is well-developed. She is not ill-appearing or diaphoretic.  HENT:     Head: Normocephalic and atraumatic.  Eyes:     Extraocular Movements: Extraocular movements intact.     Pupils: Pupils are equal, round, and reactive to light.  Cardiovascular:     Rate and Rhythm: Normal rate and regular rhythm.     Heart sounds: Normal heart sounds.  Pulmonary:     Effort: Pulmonary effort is normal.     Breath sounds: Normal breath sounds.  Abdominal:     Palpations: Abdomen is soft.     Tenderness: There is no abdominal tenderness.  Skin:    General: Skin is warm and dry.  Neurological:      Mental Status: She is alert.     Comments: CN 3-12 grossly intact. 5/5 strength in all 4 extremities. Grossly normal sensation. Normal finger to nose.      ED Results / Procedures / Treatments   Labs (all labs ordered are listed, but only abnormal results are displayed) Labs Reviewed  BASIC METABOLIC PANEL - Abnormal; Notable for the following components:      Result Value   Sodium 131 (*)    Chloride 95 (*)    Glucose, Bld 158 (*)    Creatinine, Ser 1.02 (*)    All other components within normal limits  CBC WITH DIFFERENTIAL/PLATELET  BRAIN NATRIURETIC PEPTIDE  URINALYSIS, ROUTINE W REFLEX MICROSCOPIC  I-STAT BETA HCG BLOOD, ED ([facility], [facility], [facility] ONLY)    EKG EKG Interpretation  Date/Time:  Monday July 24 2022 14:41:04 EST Ventricular Rate:  113 PR Interval:  168 QRS Duration: 82 QT Interval:  318 QTC Calculation: 436 R Axis:   104 Text Interpretation: Sinus tachycardia Rightward axis rate is faster when compared to 2020 Confirmed by Sherwood Gambler [id number]) on 07/24/2022 4:52:06 PM  Radiology DG Chest 2 View  Result Date: 07/24/2022 CLINICAL DATA:  Shortness of breath since yesterday morning EXAM: CHEST - 2 VIEW COMPARISON:  11/06/2018 FINDINGS: The heart size and mediastinal contours are within normal limits. Both lungs are clear. The visualized skeletal structures are unremarkable. IMPRESSION: No acute abnormality of the lungs. Electronically Signed   By: Delanna Ahmadi M.D.   On: 07/24/2022 15:15    Procedures Procedures    Medications Ordered in ED Medications - No data to display  ED Course/ Medical Decision Making/ A&P                             Medical Decision Making  Labs reviewed/interpreted and are overall unremarkable with no AKI, anemia, CHF.  Troponins were not sent in triage but with no chest pain I think it is unlikely this is ACS.  She reports a little bit of dyspnea though that might of just been from her high heart rate which was probably from  vomiting all day.  However now she is tolerating p.o. and I think she was probably having a hangover from significant alcohol consumption.  Right now she is completely asymptomatic and so I think PE, ACS, CNS emergency, dissection, etc. is pretty unlikely.  She is hypertensive though this is improving from when she first checked in.  Right now is about 140/100.  While this is still elevated, given it is improving, I discussed options with patient and we have decided to have her follow-up with an eye patient PCP and return if any symptoms recur or worsen.  Otherwise she will need  to check her blood pressure at home, keep a log, and try to change diet, exercise, weight loss, etc.  Return for any new or worsening symptoms and follow-up with PCP.        Final Clinical Impression(s) / ED Diagnoses Final diagnoses:  Hypertensive urgency    Rx / DC Orders ED Discharge Orders     None         Sherwood Gambler, MD 07/24/22 1724

## 2022-07-24 NOTE — ED Provider Triage Note (Signed)
Emergency Medicine Provider Triage Evaluation Note  Sheri Spencer , a 27 y.o. female  was evaluated in triage.  Pt complains of HTN concerns. Blood pressure PTA was 0000000 systolic. No antihypertensives at home.  Has associated shortness of breath, headache (resolved last night with Tylenol).  Denies chest pain, urinary symptoms, vision changes.  Notes that she uses marijuana however denies other illicit drugs.   Review of Systems  Positive:  Negative:   Physical Exam  BP (!) 162/114 (BP Location: Right Arm)   Pulse (!) 116   Temp 97.9 F (36.6 C) (Oral)   Resp 16   SpO2 98%  Gen:   Awake, no distress   Resp:  Normal effort  MSK:   Moves extremities without difficulty  Other:    Medical Decision Making  Medically screening exam initiated at 2:33 PM.  Appropriate orders placed.  Sheri Spencer was informed that the remainder of the evaluation will be completed by another provider, this initial triage assessment does not replace that evaluation, and the importance of remaining in the ED until their evaluation is complete.  Work-up initiated.    Sheri Spencer A, PA-C 07/24/22 1440

## 2022-07-24 NOTE — ED Notes (Signed)
Per MD Regenia Skeeter no need for IV and urine at this time.

## 2023-11-28 ENCOUNTER — Other Ambulatory Visit: Payer: Self-pay

## 2023-11-28 ENCOUNTER — Encounter (HOSPITAL_COMMUNITY): Payer: Self-pay | Admitting: *Deleted

## 2023-11-28 ENCOUNTER — Emergency Department (HOSPITAL_COMMUNITY)

## 2023-11-28 ENCOUNTER — Emergency Department (HOSPITAL_COMMUNITY)
Admission: EM | Admit: 2023-11-28 | Discharge: 2023-11-29 | Discharge status: Against Medical Advice | Attending: Emergency Medicine | Admitting: Emergency Medicine

## 2023-11-28 DIAGNOSIS — I10 Essential (primary) hypertension: Secondary | ICD-10-CM | POA: Diagnosis not present

## 2023-11-28 DIAGNOSIS — S99922A Unspecified injury of left foot, initial encounter: Secondary | ICD-10-CM | POA: Diagnosis not present

## 2023-11-28 DIAGNOSIS — Z5321 Procedure and treatment not carried out due to patient leaving prior to being seen by health care provider: Secondary | ICD-10-CM | POA: Diagnosis not present

## 2023-11-28 DIAGNOSIS — Y9302 Activity, running: Secondary | ICD-10-CM | POA: Diagnosis not present

## 2023-11-28 DIAGNOSIS — X58XXXA Exposure to other specified factors, initial encounter: Secondary | ICD-10-CM | POA: Insufficient documentation

## 2023-11-28 DIAGNOSIS — Z043 Encounter for examination and observation following other accident: Secondary | ICD-10-CM | POA: Diagnosis not present
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
## 2023-11-29 NOTE — ED Notes (Signed)
 Patient called x5 no response. Putting OTF.

## 2023-11-29 NOTE — ED Notes (Signed)
 Called no answer
